# Patient Record
Sex: Female | Born: 1970 | Race: White | Hispanic: No | Marital: Married | Smoking: Never smoker
Health system: Southern US, Community
[De-identification: ages and names within clinical notes are randomized; demographics above are authoritative.]

## PROBLEM LIST (undated history)

## (undated) DIAGNOSIS — R200 Anesthesia of skin: Secondary | ICD-10-CM

## (undated) DIAGNOSIS — T8859XA Other complications of anesthesia, initial encounter: Secondary | ICD-10-CM

## (undated) DIAGNOSIS — D649 Anemia, unspecified: Secondary | ICD-10-CM

## (undated) DIAGNOSIS — R9082 White matter disease, unspecified: Secondary | ICD-10-CM

## (undated) DIAGNOSIS — R112 Nausea with vomiting, unspecified: Secondary | ICD-10-CM

## (undated) DIAGNOSIS — G459 Transient cerebral ischemic attack, unspecified: Secondary | ICD-10-CM

## (undated) DIAGNOSIS — R202 Paresthesia of skin: Secondary | ICD-10-CM

## (undated) DIAGNOSIS — R519 Headache, unspecified: Secondary | ICD-10-CM

## (undated) DIAGNOSIS — G35 Multiple sclerosis: Secondary | ICD-10-CM

## (undated) DIAGNOSIS — C801 Malignant (primary) neoplasm, unspecified: Secondary | ICD-10-CM

## (undated) DIAGNOSIS — T4145XA Adverse effect of unspecified anesthetic, initial encounter: Secondary | ICD-10-CM

## (undated) DIAGNOSIS — H539 Unspecified visual disturbance: Secondary | ICD-10-CM

## (undated) DIAGNOSIS — Z9889 Other specified postprocedural states: Secondary | ICD-10-CM

## (undated) DIAGNOSIS — R51 Headache: Secondary | ICD-10-CM

## (undated) HISTORY — DX: Unspecified visual disturbance: H53.9

## (undated) HISTORY — PX: APPENDECTOMY: SHX54

---

## 2007-04-12 ENCOUNTER — Ambulatory Visit: Payer: Self-pay | Admitting: Gastroenterology

## 2007-11-04 ENCOUNTER — Ambulatory Visit: Payer: Self-pay

## 2008-11-06 ENCOUNTER — Ambulatory Visit: Payer: Self-pay

## 2009-11-08 ENCOUNTER — Ambulatory Visit: Payer: Self-pay

## 2009-12-19 ENCOUNTER — Ambulatory Visit: Payer: Self-pay | Admitting: Obstetrics and Gynecology

## 2009-12-21 ENCOUNTER — Ambulatory Visit: Payer: Self-pay | Admitting: Obstetrics and Gynecology

## 2009-12-24 ENCOUNTER — Inpatient Hospital Stay: Payer: Self-pay | Admitting: Obstetrics and Gynecology

## 2010-10-27 HISTORY — PX: ABDOMINAL HYSTERECTOMY: SHX81

## 2011-02-19 ENCOUNTER — Ambulatory Visit: Payer: Self-pay | Admitting: Obstetrics and Gynecology

## 2011-02-21 ENCOUNTER — Ambulatory Visit: Payer: Self-pay | Admitting: Obstetrics and Gynecology

## 2011-02-25 LAB — PATHOLOGY REPORT

## 2011-03-06 ENCOUNTER — Ambulatory Visit: Payer: Self-pay | Admitting: Obstetrics and Gynecology

## 2011-05-15 ENCOUNTER — Ambulatory Visit: Payer: Self-pay | Admitting: Obstetrics and Gynecology

## 2012-05-25 ENCOUNTER — Ambulatory Visit: Payer: Self-pay | Admitting: Obstetrics and Gynecology

## 2013-05-26 ENCOUNTER — Ambulatory Visit: Payer: Self-pay | Admitting: Obstetrics and Gynecology

## 2013-07-11 ENCOUNTER — Ambulatory Visit: Payer: Self-pay | Admitting: Family Medicine

## 2013-10-27 DIAGNOSIS — C4491 Basal cell carcinoma of skin, unspecified: Secondary | ICD-10-CM | POA: Insufficient documentation

## 2015-08-29 ENCOUNTER — Other Ambulatory Visit: Payer: Self-pay | Admitting: Obstetrics and Gynecology

## 2015-08-29 DIAGNOSIS — Z79890 Hormone replacement therapy (postmenopausal): Secondary | ICD-10-CM

## 2015-08-29 DIAGNOSIS — E8940 Asymptomatic postprocedural ovarian failure: Secondary | ICD-10-CM | POA: Insufficient documentation

## 2015-08-29 DIAGNOSIS — Z1231 Encounter for screening mammogram for malignant neoplasm of breast: Secondary | ICD-10-CM

## 2015-09-07 ENCOUNTER — Ambulatory Visit: Payer: Self-pay

## 2015-09-19 ENCOUNTER — Ambulatory Visit
Admission: RE | Admit: 2015-09-19 | Discharge: 2015-09-19 | Disposition: A | Discharge status: Home / Self Care | Payer: BC Managed Care – PPO | Source: Ambulatory Visit | Attending: Obstetrics and Gynecology | Admitting: Obstetrics and Gynecology

## 2015-09-19 DIAGNOSIS — Z1231 Encounter for screening mammogram for malignant neoplasm of breast: Secondary | ICD-10-CM | POA: Diagnosis present

## 2016-01-02 ENCOUNTER — Other Ambulatory Visit: Payer: Self-pay | Admitting: Neurology

## 2016-01-02 DIAGNOSIS — R410 Disorientation, unspecified: Secondary | ICD-10-CM

## 2016-01-18 ENCOUNTER — Ambulatory Visit: Payer: BC Managed Care – PPO

## 2016-06-27 DIAGNOSIS — G459 Transient cerebral ischemic attack, unspecified: Secondary | ICD-10-CM

## 2016-06-27 HISTORY — DX: Transient cerebral ischemic attack, unspecified: G45.9

## 2016-07-24 ENCOUNTER — Observation Stay
Admission: EM | Admit: 2016-07-24 | Discharge: 2016-07-25 | Disposition: A | Discharge status: Home / Self Care | Payer: BC Managed Care – PPO | Attending: Internal Medicine | Admitting: Internal Medicine

## 2016-07-24 ENCOUNTER — Ambulatory Visit (INDEPENDENT_AMBULATORY_CARE_PROVIDER_SITE_OTHER)
Admission: EM | Admit: 2016-07-24 | Discharge: 2016-07-24 | Disposition: A | Discharge status: Other Acute Inpatient Hospital | Payer: BC Managed Care – PPO | Source: Home / Self Care | Attending: Family Medicine | Admitting: Family Medicine

## 2016-07-24 ENCOUNTER — Emergency Department: Payer: BC Managed Care – PPO

## 2016-07-24 ENCOUNTER — Encounter: Payer: Self-pay | Admitting: Emergency Medicine

## 2016-07-24 ENCOUNTER — Observation Stay
Admit: 2016-07-24 | Discharge: 2016-07-24 | Disposition: A | Discharge status: Home / Self Care | Payer: BC Managed Care – PPO | Attending: Internal Medicine | Admitting: Internal Medicine

## 2016-07-24 ENCOUNTER — Encounter: Payer: Self-pay | Admitting: *Deleted

## 2016-07-24 DIAGNOSIS — I639 Cerebral infarction, unspecified: Secondary | ICD-10-CM

## 2016-07-24 DIAGNOSIS — I10 Essential (primary) hypertension: Secondary | ICD-10-CM | POA: Diagnosis not present

## 2016-07-24 DIAGNOSIS — G43109 Migraine with aura, not intractable, without status migrainosus: Principal | ICD-10-CM | POA: Insufficient documentation

## 2016-07-24 DIAGNOSIS — Z90710 Acquired absence of both cervix and uterus: Secondary | ICD-10-CM | POA: Insufficient documentation

## 2016-07-24 DIAGNOSIS — I081 Rheumatic disorders of both mitral and tricuspid valves: Secondary | ICD-10-CM | POA: Insufficient documentation

## 2016-07-24 DIAGNOSIS — G8194 Hemiplegia, unspecified affecting left nondominant side: Secondary | ICD-10-CM | POA: Diagnosis not present

## 2016-07-24 DIAGNOSIS — R200 Anesthesia of skin: Secondary | ICD-10-CM | POA: Insufficient documentation

## 2016-07-24 DIAGNOSIS — H539 Unspecified visual disturbance: Secondary | ICD-10-CM | POA: Diagnosis not present

## 2016-07-24 DIAGNOSIS — Z885 Allergy status to narcotic agent status: Secondary | ICD-10-CM | POA: Diagnosis not present

## 2016-07-24 DIAGNOSIS — Z823 Family history of stroke: Secondary | ICD-10-CM | POA: Diagnosis not present

## 2016-07-24 DIAGNOSIS — G459 Transient cerebral ischemic attack, unspecified: Secondary | ICD-10-CM | POA: Insufficient documentation

## 2016-07-24 DIAGNOSIS — E785 Hyperlipidemia, unspecified: Secondary | ICD-10-CM | POA: Insufficient documentation

## 2016-07-24 DIAGNOSIS — R410 Disorientation, unspecified: Secondary | ICD-10-CM

## 2016-07-24 DIAGNOSIS — H5340 Unspecified visual field defects: Secondary | ICD-10-CM | POA: Diagnosis not present

## 2016-07-24 DIAGNOSIS — R531 Weakness: Secondary | ICD-10-CM

## 2016-07-24 LAB — COMPREHENSIVE METABOLIC PANEL
ALBUMIN: 4.4 g/dL (ref 3.5–5.0)
ALK PHOS: 75 U/L (ref 38–126)
ALT: 33 U/L (ref 14–54)
AST: 20 U/L (ref 15–41)
Anion gap: 8 (ref 5–15)
BILIRUBIN TOTAL: 0.7 mg/dL (ref 0.3–1.2)
BUN: 11 mg/dL (ref 6–20)
CALCIUM: 9.4 mg/dL (ref 8.9–10.3)
CO2: 27 mmol/L (ref 22–32)
Chloride: 104 mmol/L (ref 101–111)
Creatinine, Ser: 0.92 mg/dL (ref 0.44–1.00)
GFR calc Af Amer: >60 mL/min (ref >60)
GFR calc non Af Amer: >60 mL/min (ref >60)
GLUCOSE: 98 mg/dL (ref 65–99)
Potassium: 3.6 mmol/L (ref 3.5–5.1)
SODIUM: 139 mmol/L (ref 135–145)
TOTAL PROTEIN: 7.7 g/dL (ref 6.5–8.1)

## 2016-07-24 LAB — URINALYSIS COMPLETE WITH MICROSCOPIC (ARMC ONLY)
BACTERIA UA: NONE SEEN
BILIRUBIN URINE: NEGATIVE
Glucose, UA: NEGATIVE mg/dL
HGB URINE DIPSTICK: NEGATIVE
LEUKOCYTES UA: NEGATIVE
NITRITE: NEGATIVE
PH: 7 (ref 5.0–8.0)
Protein, ur: NEGATIVE mg/dL
Specific Gravity, Urine: 1.004 — ABNORMAL LOW (ref 1.005–1.030)

## 2016-07-24 LAB — CBC
HCT: 43.2 % (ref 35.0–47.0)
HEMOGLOBIN: 15.1 g/dL (ref 12.0–16.0)
MCH: 31.3 pg (ref 26.0–34.0)
MCHC: 35 g/dL (ref 32.0–36.0)
MCV: 89.4 fL (ref 80.0–100.0)
Platelets: 200 10*3/uL (ref 150–440)
RBC: 4.84 MIL/uL (ref 3.80–5.20)
RDW: 12.3 % (ref 11.5–14.5)
WBC: 6.9 10*3/uL (ref 3.6–11.0)

## 2016-07-24 LAB — URINE DRUG SCREEN, QUALITATIVE (ARMC ONLY)
AMPHETAMINES, UR SCREEN: NOT DETECTED
BARBITURATES, UR SCREEN: NOT DETECTED
BENZODIAZEPINE, UR SCRN: NOT DETECTED
COCAINE METABOLITE, UR ~~LOC~~: NOT DETECTED
Cannabinoid 50 Ng, Ur ~~LOC~~: NOT DETECTED
MDMA (Ecstasy)Ur Screen: NOT DETECTED
Methadone Scn, Ur: NOT DETECTED
OPIATE, UR SCREEN: NOT DETECTED
PHENCYCLIDINE (PCP) UR S: NOT DETECTED
Tricyclic, Ur Screen: NOT DETECTED

## 2016-07-24 LAB — PROTIME-INR
INR: 0.98
Prothrombin Time: 13 seconds (ref 11.4–15.2)

## 2016-07-24 LAB — DIFFERENTIAL
BASOS ABS: 0 10*3/uL (ref 0–0.1)
Basophils Relative: 0 %
Eosinophils Absolute: 0 10*3/uL (ref 0–0.7)
Eosinophils Relative: 1 %
LYMPHS ABS: 1.7 10*3/uL (ref 1.0–3.6)
LYMPHS PCT: 25 %
Monocytes Absolute: 0.4 10*3/uL (ref 0.2–0.9)
Monocytes Relative: 6 %
NEUTROS ABS: 4.7 10*3/uL (ref 1.4–6.5)
NEUTROS PCT: 68 %

## 2016-07-24 LAB — APTT: APTT: 27 s (ref 24–36)

## 2016-07-24 LAB — TROPONIN I: Troponin I: <0.03 ng/mL (ref <0.03)

## 2016-07-24 LAB — ETHANOL: Alcohol, Ethyl (B): <5 mg/dL (ref <5)

## 2016-07-24 MED ORDER — ASPIRIN 325 MG PO TABS
325.0000 mg | ORAL_TABLET | Freq: Every day | ORAL | Status: DC
  Administered 2016-07-24: 20:00:00 325 mg via ORAL
  Filled 2016-07-24 (×2): qty 1

## 2016-07-24 MED ORDER — CHOLECALCIFEROL 10 MCG (400 UNIT) PO TABS
400.0000 [IU] | ORAL_TABLET | Freq: Every day | ORAL | Status: DC
  Administered 2016-07-24: 400 [IU] via ORAL
  Filled 2016-07-24 (×3): qty 1

## 2016-07-24 MED ORDER — ACETAMINOPHEN 650 MG RE SUPP: 650.0000 mg | Freq: Four times a day (QID) | RECTAL | Status: DC | PRN

## 2016-07-24 MED ORDER — ONDANSETRON HCL 4 MG PO TABS: 4.0000 mg | ORAL_TABLET | Freq: Four times a day (QID) | ORAL | Status: DC | PRN

## 2016-07-24 MED ORDER — ADULT MULTIVITAMIN W/MINERALS CH
1.0000 | ORAL_TABLET | Freq: Every day | ORAL | Status: DC
  Administered 2016-07-24: 20:00:00 1 via ORAL
  Filled 2016-07-24 (×2): qty 1

## 2016-07-24 MED ORDER — ONDANSETRON HCL 4 MG/2ML IJ SOLN
4.0000 mg | Freq: Four times a day (QID) | INTRAMUSCULAR | Status: DC | PRN
Start: 2016-07-24 — End: 2016-07-25

## 2016-07-24 MED ORDER — ESTRADIOL 1 MG PO TABS
2.0000 mg | ORAL_TABLET | Freq: Every day | ORAL | Status: DC
  Administered 2016-07-24: 2 mg via ORAL
  Filled 2016-07-24 (×2): qty 2
  Filled 2016-07-24: qty 1

## 2016-07-24 MED ORDER — ACETAMINOPHEN 325 MG PO TABS: 650.0000 mg | ORAL_TABLET | Freq: Four times a day (QID) | ORAL | Status: DC | PRN

## 2016-07-24 MED ORDER — ASPIRIN 300 MG RE SUPP: 300.0000 mg | Freq: Every day | RECTAL | Status: DC

## 2016-07-24 MED ORDER — INFLUENZA VAC SPLIT QUAD 0.5 ML IM SUSY: 0.5000 mL | PREFILLED_SYRINGE | INTRAMUSCULAR | Status: DC

## 2016-07-24 MED ORDER — STROKE: EARLY STAGES OF RECOVERY BOOK
Freq: Once | Status: AC
  Administered 2016-07-24: 16:00:00

## 2016-07-24 MED ORDER — DIAZEPAM 5 MG PO TABS: 5.0000 mg | ORAL_TABLET | Freq: Once | ORAL | Status: DC

## 2016-07-24 MED ORDER — ENOXAPARIN SODIUM 40 MG/0.4ML ~~LOC~~ SOLN
40.0000 mg | SUBCUTANEOUS | Status: DC
  Filled 2016-07-24: qty 0.4

## 2016-07-24 MED ORDER — ASPIRIN 81 MG PO CHEW
324.0000 mg | CHEWABLE_TABLET | Freq: Once | ORAL | Status: AC
  Administered 2016-07-24: 324 mg via ORAL
  Filled 2016-07-24: qty 4

## 2016-07-24 MED ORDER — SODIUM CHLORIDE 0.9% FLUSH
3.0000 mL | Freq: Two times a day (BID) | INTRAVENOUS | Status: DC
  Administered 2016-07-24 – 2016-07-25 (×3): 3 mL via INTRAVENOUS

## 2016-07-24 NOTE — ED Notes (Signed)
Patient made aware of need of urine sample  

## 2016-07-24 NOTE — ED Notes (Signed)
Patient transported to CT 

## 2016-07-24 NOTE — H&P (Signed)
[facility] [place] NAME: Jeanette Lang    MR#:  [id number]  DATE OF BIRTH:  [birth year]   DATE OF ADMISSION:  07/24/2016  PRIMARY CARE PHYSICIAN: Golden Pop, MD   REQUESTING/REFERRING PHYSICIAN: Joni Fears  CHIEF COMPLAINT:   Chief Complaint  Patient presents with  . Weakness    HISTORY OF PRESENT ILLNESS:  Jeanette Lang  is a 45 y.o. female without significant medical history who is presenting with acute onset 8:10 07/16/2016 with left peripheral vision deficit. That subsequently resolved Complaining of numbness left side of face left upper extremity left leg she originally went to urgent care with an redirect [facility] emergency department further workup and evaluation. Vision has resolved still complaining of numbness denies any weakness further symptomatology.  PAST MEDICAL HISTORY:   Past Medical History:  Diagnosis Date  . Medical history non-contributory     PAST SURGICAL HISTORY:   Past Surgical History:  Procedure Laterality Date  . ABDOMINAL HYSTERECTOMY  2012   complete  . APPENDECTOMY      SOCIAL HISTORY:   Social History  Substance Use Topics  . Smoking status: Never Smoker  . Smokeless tobacco: Never Used  . Alcohol use No    FAMILY HISTORY:   Family History  Problem Relation Age of Onset  . CVA Neg Hx     DRUG ALLERGIES:   Allergies  Allergen Reactions  . Codeine Nausea And Vomiting    REVIEW OF SYSTEMS:  REVIEW OF SYSTEMS:  CONSTITUTIONAL: Denies fevers, chills, fatigue, weakness.  EYES: Denies blurred vision, double vision, or eye pain.  EARS, NOSE, THROAT: Denies tinnitus, ear pain, hearing loss.  RESPIRATORY: denies cough, shortness of breath, wheezing  CARDIOVASCULAR: Denies chest pain, palpitations, edema.  GASTROINTESTINAL: Denies nausea, vomiting, diarrhea, abdominal pain.  GENITOURINARY: Denies dysuria, hematuria.  ENDOCRINE: Denies nocturia or thyroid problems. HEMATOLOGIC  AND LYMPHATIC: Denies easy bruising or bleeding.  SKIN: Denies rash or lesions.  MUSCULOSKELETAL: Denies pain in neck, back, shoulder, knees, hips, or further arthritic symptoms.  NEUROLOGIC: Denies paralysis, paresthesias. Positive numbness PSYCHIATRIC: Denies anxiety or depressive symptoms. Otherwise full review of systems performed by me is negative.   MEDICATIONS AT HOME:   Prior to Admission medications   Medication Sig Start Date End Date Taking? Authorizing Provider  Cholecalciferol (VITAMIN D PO) Take 1 tablet by mouth daily.    Historical Provider, MD  estradiol (ESTRACE) 2 MG tablet Take 2 mg by mouth daily.    Historical Provider, MD  Multiple Vitamin (MULTIVITAMIN) tablet Take 1 tablet by mouth daily.    Historical Provider, MD      VITAL SIGNS:  Blood pressure 126/75, pulse 85, temperature 98.6 F (37 C), temperature source Oral, resp. rate 19, height 4\' 11"  (1.499 m), weight 53.1 kg (117 lb), SpO2 98 %.  PHYSICAL EXAMINATION:  VITAL SIGNS: Vitals:   07/24/16 1400 07/24/16 1430  BP: 139/77 126/75  Pulse: 81 85  Resp: (!) 21 19  Temp:     GENERAL:45 y.o.female currently in no acute distress.  HEAD: Normocephalic, atraumatic.  EYES: Pupils equal, round, reactive to light. Extraocular muscles intact. No scleral icterus.  MOUTH: Moist mucosal membrane. Dentition intact. No abscess noted.  EAR, NOSE, THROAT: Clear without exudates. No external lesions.  NECK: Supple. No thyromegaly. No nodules. No JVD.  PULMONARY: Clear to ascultation, without wheeze rails or rhonci. No use of accessory muscles, Good respiratory effort. good air entry bilaterally CHEST: Nontender to palpation.  CARDIOVASCULAR:  S1 and S2. Regular rate and rhythm. No murmurs, rubs, or gallops. No edema. Pedal pulses 2+ bilaterally.  GASTROINTESTINAL: Soft, nontender, nondistended. No masses. Positive bowel sounds. No hepatosplenomegaly.  MUSCULOSKELETAL: No swelling, clubbing, or edema. Range of motion  full in all extremities.  NEUROLOGIC: Cranial nerves II through XII are intact. Pronator drift within normal limits subjective light touch diminished left face arm and leg reflexes intact SKIN: No ulceration, lesions, rashes, or cyanosis. Skin warm and dry. Turgor intact.  PSYCHIATRIC: Mood, affect within normal limits. The patient is awake, alert and oriented x 3. Insight, judgment intact.    LABORATORY PANEL:   CBC  Recent Labs Lab 07/24/16 1315  WBC 6.9  HGB 15.1  HCT 43.2  PLT 200   ------------------------------------------------------------------------------------------------------------------  Chemistries   Recent Labs Lab 07/24/16 1315  NA 139  K 3.6  CL 104  CO2 27  GLUCOSE 98  BUN 11  CREATININE 0.92  CALCIUM 9.4  AST 20  ALT 33  ALKPHOS 75  BILITOT 0.7   ------------------------------------------------------------------------------------------------------------------  Cardiac Enzymes  Recent Labs Lab 07/24/16 1315  TROPONINI <0.03   ------------------------------------------------------------------------------------------------------------------  RADIOLOGY:  Ct Head Code Stroke W/o Cm  Result Date: 07/24/2016 CLINICAL DATA:  Code stroke. Sudden onset left visual field deficit, occipital paresthesia, and left-sided weakness. EXAM: CT HEAD WITHOUT CONTRAST TECHNIQUE: Contiguous axial images were obtained from the base of the skull through the vertex without intravenous contrast. COMPARISON:  None. FINDINGS: Brain: There is no evidence of acute cortical infarct, intracranial hemorrhage, mass, midline shift, or extra-axial fluid collection. The ventricles and sulci are normal. Vascular: No hyperdense vessel or unexpected calcification. Skull: No fracture or focal osseous lesion. Sinuses/Orbits: Unremarkable. Other: None. ASPECTS [facility] Stroke Program Early CT Score) - Ganglionic level infarction (caudate, lentiform nuclei, internal capsule, insula, M1-M3  cortex): 7 - Supraganglionic infarction (M4-M6 cortex): 3 Total score (0-10 with 10 being normal): 10 IMPRESSION: 1. Unremarkable head CT. 2. ASPECTS is 10. These results were called by telephone at the time of interpretation on 07/24/2016 at 1:44 pm to Dr. Carrie Mew , who verbally acknowledged these results. Electronically Signed   By: Logan Bores M.D.   On: 07/24/2016 13:44    EKG:   Orders placed or performed during the hospital encounter of 07/24/16  . ED EKG  . ED EKG  . EKG 12-Lead  . EKG 12-Lead    IMPRESSION AND PLAN:   45 year old Caucasian female without significant medical history presenting with left-sided numbness  1. TIA, unspecified: Initiate aspirin/statin therapy if LDL greater than 70, stroke protocol/precautions, place on telemetry, trend cardiac enzymes, check MRI brain, complete echocardiogram, lipid panel, hemoglobin A1c, bilateral carotid Doppler, permissive hypertension first 24 hours treating blood pressure only greater than 220/120. Onset of symptoms 8:10, no TPA given severity of symptoms and outside of window  All the records are reviewed and case discussed with ED provider. Management plans discussed with the patient, family and they are in agreement.  CODE STATUS: Full  TOTAL TIME TAKING CARE OF THIS PATIENT: 33 minutes.    Oday Ridings,  Karenann Cai.D on 07/24/2016 at 3:02 PM  Between 7am to 6pm - Pager - [phone]  After 6pm: House Pager: - [place] [facility]  Office  [phone]  CC: Primary care physician; Golden Pop, MD

## 2016-07-24 NOTE — ED Notes (Signed)
Attempted to call report x 1  

## 2016-07-24 NOTE — ED Notes (Signed)
Last Known Well 0810.

## 2016-07-24 NOTE — ED Triage Notes (Signed)
Patient started having limited periferal vision in left eye, followed by numbness on the left side of her face and head. Patient reports having tunnel vision while at work.

## 2016-07-24 NOTE — ED Triage Notes (Signed)
Per [facility], patient comes from [facility] due to left sided weakness. Patient was sitting in her car, putting on makeup when she noticed her vision in her left eye "go white".  Patient drove to work fine. When patient got work her vision was still not back to normal. Patient turned her head to the right and noticed a tingling feeling to the back of her head. At [facility] patient was slow to answer questions and was unable to state who the president was. Now, patient describes numbness to left hand and both sides of head. Patient A&O x4.

## 2016-07-24 NOTE — ED Notes (Signed)
Admitting MD at bedside.

## 2016-07-24 NOTE — ED Provider Notes (Signed)
[facility]    CSN: [id number] Arrival date & time: 07/24/16  1131     History   Chief Complaint Chief Complaint  Patient presents with  . Visual Field Change  . Numbness    HPI Jeanette Lang is a 45 y.o. female.   Patient's here because of visual disturbance and. Patient states that this morning she was getting way to drive to work this is about 8:00 this morning she had to be at work by 8:30. She reports in the left eye seeing things in the periphery which he couldn't get rid of. She states that then she developed tunnel vision the left eye and the left eye became greater. She was still able to drive to work maybe to work and had her vital signs checked by the school nurse. 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 She reports though during this time along with visual disturbances feeling dizzy and lightheaded. She had some mental problems and did not feel sharp. She states that she still can't remember certain things and that she has really concentrate before she can remember things that she should remember liked her medications when she was talking to the nurse who triaged to her this afternoon. She reports having some memory problems about 2-3 months ago with her husband but it was a very short period and it cleared completely she states that the visual problem is pretty much cleared but she still has some issues with focusing and remembering things. She denies any extremity weakness seizure activity or numbness. Past medical history she does not smoke doesn't know pertinent family medical history as far as strokes in the immediate family. She has had a total abdominal hysterectomy which followed a partial hysterectomy. She is on hormonal treatment and she has had appendectomy before. She does not smoke. She is allergic to codeine   The history is provided by the patient and a friend. No language interpreter was  used.  Eye Problem  Location:  Left eye Severity:  Severe Onset quality:  Sudden Duration:  4 hours Timing:  Unable to specify Progression:  Resolved Chronicity:  New Context: not burn, not chemical exposure, not contact lens problem, not direct trauma, not foreign body, not using machinery, not scratch, not smoke exposure and not UV exposure   Relieved by:  None tried Worsened by:  Nothing Associated symptoms: blurred vision, decreased vision and scotomas   Associated symptoms: no inflammation, no itching, no nausea, no numbness, no photophobia, no swelling and no weakness   Risk factors: no conjunctival hemorrhage, no exposure to pinkeye, no previous injury to eye, no recent herpes zoster and no recent URI     History reviewed. No pertinent past medical history.  There are no active problems to display for this patient.   Past Surgical History:  Procedure Laterality Date  . ABDOMINAL HYSTERECTOMY  2012   complete  . APPENDECTOMY      OB History    No data available       Home Medications    Prior to Admission medications   Medication Sig Start Date End Date Taking? Authorizing Provider  Cholecalciferol (VITAMIN D PO) Take 1 tablet by mouth daily.   Yes Historical Provider, MD  estradiol (ESTRACE) 2 MG tablet Take 2 mg by mouth daily.   Yes Historical Provider, MD  Multiple Vitamin (MULTIVITAMIN) tablet Take 1 tablet by mouth daily.  Yes Historical Provider, MD    Family History History reviewed. No pertinent family history.  Social History Social History  Substance Use Topics  . Smoking status: Never Smoker  . Smokeless tobacco: Never Used  . Alcohol use No     Allergies   Codeine   Review of Systems Review of Systems  Constitutional: Positive for activity change.  Eyes: Positive for blurred vision. Negative for photophobia and itching.  Gastrointestinal: Negative for nausea.  Neurological: Positive for dizziness. Negative for weakness and numbness.        Memory loss   Psychiatric/Behavioral: Negative for suicidal ideas.  All other systems reviewed and are negative.    Physical Exam Triage Vital Signs ED Triage Vitals  Enc Vitals Group     BP 07/24/16 1142 (!) 153/85     Pulse Rate 07/24/16 1142 77     Resp 07/24/16 1142 16     Temp 07/24/16 1142 97.8 F (36.6 C)     Temp Source 07/24/16 1142 Oral     SpO2 07/24/16 1142 100 %     Weight 07/24/16 1143 116 lb (52.6 kg)     Height 07/24/16 1143 4\' 11"  (1.499 m)     Head Circumference --      Peak Flow --      Pain Score 07/24/16 1150 0     Pain Loc --      Pain Edu? --      Excl. in [place]? --    No data found.   Updated Vital Signs BP (!) 153/85 (BP Location: Right Arm)   Pulse 77   Temp 97.8 F (36.6 C) (Oral)   Resp 16   Ht 4\' 11"  (1.499 m)   Wt 116 lb (52.6 kg)   SpO2 100%   BMI 23.43 kg/m   Visual Acuity Right Eye Distance:   Left Eye Distance:   Bilateral Distance:    Right Eye Near:   Left Eye Near:    Bilateral Near:     Physical Exam  Constitutional: She appears well-developed.  HENT:  Head: Normocephalic and atraumatic.  Right Ear: External ear normal.  Left Ear: External ear normal.  Eyes: EOM are normal. Pupils are equal, round, and reactive to light.  Neck: Normal range of motion. Neck supple.  Cardiovascular: Normal rate and regular rhythm.   Pulmonary/Chest: Effort normal and breath sounds normal.  Musculoskeletal: Normal range of motion. She exhibits no tenderness.  Neurological: She has normal reflexes. She displays normal reflexes. No cranial nerve deficit. She exhibits abnormal muscle tone. Coordination normal.  Patient has some weakness of the left upper extremity not profound but not the equal to the right  Skin: Skin is warm and dry.  Psychiatric: Her mood appears anxious. Cognition and memory are impaired.  Patient's anxious she can remember person place and time but there is An Remembering the Presence First Name Initially It  Takes Her A Few Seconds before She Can Remember That the Present First Name Is [name] Once She Is Able to Remember Is [name] She Is Also Positive for Large Remember His Name      UC Treatments / Results  Labs (all labs ordered are listed, but only abnormal results are displayed) Labs Reviewed - No data to display  EKG  EKG Interpretation None      ED ECG REPORT I, Crisol Muecke H, the attending physician, personally viewed and interpreted this ECG.   Date: 07/24/2016  EKG Time:11:51:09  Rate: 76  Rhythm: normal EKG, normal sinus rhythm  Axis: 41  Intervals:none  ST&T Change: none Radiology No results found.  Procedures Procedures (including critical care time)  Medications Ordered in UC Medications - No data to display   Initial Impression / Assessment and Plan / UC Course  I have reviewed the triage vital signs and the nursing notes.  Pertinent labs & imaging results that were available during my care of the patient were reviewed by me and considered in my medical decision making (see chart for details).  Clinical Course    Patient apparently has had some type of cerebrovascular accident is been now more than 4 hours since initial presentation. I recommended EMS transportation to [facility] ED patient was somewhat reluctant friend finally convinced her to go by EMS. Colletta Maryland the charge nurse at [facility] ED notified patient's M.D. transport.  Final Clinical Impressions(s) / UC Diagnoses   Final diagnoses:  Visual disturbance  Mental confusion    New Prescriptions Discharge Medication List as of 07/24/2016 12:33 PM       Frederich Cha, MD 07/24/16 [phone]

## 2016-07-24 NOTE — ED Provider Notes (Signed)
[facility] Emergency Department Provider Note  ____________________________________________  Time seen: Approximately 1:23 PM  I have reviewed the triage vital signs and the nursing notes.   HISTORY  Chief Complaint Weakness    HPI Jeanette Lang is a 45 y.o. female sudden onset of left visual field deficit at 8 AM. She also subsided develop paresthesia in the occiput. She was seen at urgent care and referred to the ED for further evaluation. She denies ever having anything like this before. No recent illness, no trauma, no hypertension hyperlipidemia diabetes.     Past Medical History:  Diagnosis Date  . Medical history non-contributory      Patient Active Problem List   Diagnosis Date Noted  . TIA (transient ischemic attack) 07/24/2016     Past Surgical History:  Procedure Laterality Date  . ABDOMINAL HYSTERECTOMY  2012   complete  . APPENDECTOMY       Prior to Admission medications   Medication Sig Start Date End Date Taking? Authorizing Provider  Cholecalciferol (VITAMIN D PO) Take 1 tablet by mouth daily.    Historical Provider, MD  estradiol (ESTRACE) 2 MG tablet Take 2 mg by mouth daily.    Historical Provider, MD  Multiple Vitamin (MULTIVITAMIN) tablet Take 1 tablet by mouth daily.    Historical Provider, MD     Allergies Codeine   Family History  Problem Relation Age of Onset  . CVA Neg Hx     Social History Social History  Substance Use Topics  . Smoking status: Never Smoker  . Smokeless tobacco: Never Used  . Alcohol use No    Review of Systems  Constitutional:   No fever or chills.  ENT:   No sore throat. No rhinorrhea. Cardiovascular:   No chest pain. Respiratory:   No dyspnea or cough. Gastrointestinal:   Negative for abdominal pain, vomiting and diarrhea.  Genitourinary:   Negative for dysuria or difficulty urinating. Musculoskeletal:   Negative for focal pain or swelling Neurological:   positive  headache and visual changes as aboves 10-point ROS otherwise negative.  ____________________________________________   PHYSICAL EXAM:  VITAL SIGNS: ED Triage Vitals  Enc Vitals Group     BP 07/24/16 1315 (!) 141/84     Pulse Rate 07/24/16 1315 83     Resp 07/24/16 1315 17     Temp --      Temp src --      SpO2 07/24/16 1315 100 %     Weight 07/24/16 1300 117 lb (53.1 kg)     Height 07/24/16 1300 4\' 11"  (1.499 m)     Head Circumference --      Peak Flow --      Pain Score 07/24/16 1300 0     Pain Loc --      Pain Edu? --      Excl. in [place]? --     Vital signs reviewed, nursing assessments reviewed.   Constitutional:   Alert and oriented. Well appearing and in no distress. Eyes:   No scleral icterus. No conjunctival pallor. PERRL. EOMI.  No nystagmus.funduscopy unremarkable ENT   Head:   Normocephalic and atraumatic.   Nose:   No congestion/rhinnorhea. No septal hematoma   Mouth/Throat:   MMM, no pharyngeal erythema. No peritonsillar mass.    Neck:   No stridor. No SubQ emphysema. No meningismus. Hematological/Lymphatic/Immunilogical:   No cervical lymphadenopathy. Cardiovascular:   RRR. Symmetric bilateral radial and DP pulses.  No murmurs.  Respiratory:  Normal respiratory effort without tachypnea nor retractions. Breath sounds are clear and equal bilaterally. No wheezes/rales/rhonchi. Gastrointestinal:   Soft and nontender. Non distended. There is no CVA tenderness.  No rebound, rigidity, or guarding. Genitourinary:   deferred Musculoskeletal:   Nontender with normal range of motion in all extremities. No joint effusions.  No lower extremity tenderness.  No edema. Neurologic:   Some word finding difficulty, some inadvertent word substitution's.  CN 2-10 normal. Visual fields normal Motor exam reveals slight relative weakness of the left arm and left leg globally compared to the right Finger to nose normal. No pronator drift.Marland Kitchen NIH stroke scale 4  Skin:     Skin is warm, dry and intact. No rash noted.  No petechiae, purpura, or bullae.  ____________________________________________    LABS (pertinent positives/negatives) (all labs ordered are listed, but only abnormal results are displayed) Labs Reviewed  PROTIME-INR  APTT  CBC  DIFFERENTIAL  COMPREHENSIVE METABOLIC PANEL  TROPONIN I  ETHANOL  URINE DRUG SCREEN, QUALITATIVE ([facility] ONLY)  URINALYSIS COMPLETEWITH MICROSCOPIC ([place])  CBC  CREATININE, SERUM   ____________________________________________   EKG  Interpreted by me  Date: 07/24/2016  Rate: 86  Rhythm: normal sinus rhythm  QRS Axis: normal  Intervals: normal  ST/T Wave abnormalities: normal  Conduction Disutrbances: none  Narrative Interpretation: unremarkable      ____________________________________________    RADIOLOGY  CT head unremarkable  ____________________________________________   PROCEDURES Procedures  ____________________________________________   INITIAL IMPRESSION / ASSESSMENT AND PLAN / ED COURSE  Pertinent labs & imaging results that were available during my care of the patient were reviewed by me and considered in my medical decision making (see chart for details).  Patient presents withheadache vision change and found to have left-sided weakness. Deficits aren't severe, stroke scale is low, and has been 5 hours since symptom onset. I believe the risk of TPA outweighs the benefit at this point and she is not a suitable candidate. We'll proceed with stroke workup.   Clinical Course    ----------------------------------------- 2:45 PM on 07/24/2016 -----------------------------------------  Workup unremarkable. Aspirin given. Recommended hospitalization for further workup and risk stratification and risk factor modification, to which patient agrees. Case discussed with hospitalist. ____________________________________________   FINAL CLINICAL IMPRESSION(S) / ED  DIAGNOSES  Final diagnoses:  Acute CVA (cerebrovascular accident) [facility])       Portions of this note were generated with dragon dictation software. Dictation errors may occur despite best attempts at proofreading.    Carrie Mew, MD 07/24/16 [phone]

## 2016-07-25 ENCOUNTER — Observation Stay: Payer: BC Managed Care – PPO

## 2016-07-25 ENCOUNTER — Observation Stay (HOSPITAL_COMMUNITY): Payer: BC Managed Care – PPO

## 2016-07-25 DIAGNOSIS — H539 Unspecified visual disturbance: Secondary | ICD-10-CM

## 2016-07-25 DIAGNOSIS — G8194 Hemiplegia, unspecified affecting left nondominant side: Secondary | ICD-10-CM | POA: Diagnosis not present

## 2016-07-25 DIAGNOSIS — M6289 Other specified disorders of muscle: Secondary | ICD-10-CM | POA: Diagnosis not present

## 2016-07-25 DIAGNOSIS — G43109 Migraine with aura, not intractable, without status migrainosus: Secondary | ICD-10-CM

## 2016-07-25 LAB — LIPID PANEL
Cholesterol: 183 mg/dL (ref 0–200)
HDL: 55 mg/dL (ref >40)
LDL Cholesterol: 108 mg/dL — ABNORMAL HIGH (ref 0–99)
TRIGLYCERIDES: 99 mg/dL (ref <150)
Total CHOL/HDL Ratio: 3.3 RATIO
VLDL: 20 mg/dL (ref 0–40)

## 2016-07-25 LAB — ECHOCARDIOGRAM COMPLETE
E decel time: 155 msec
EERAT: 6.9
FS: 34 % (ref 28–44)
HEIGHTINCHES: 59 in
IV/PV OW: 0.86
LA diam end sys: 30 mm
LA diam index: 1.99 cm/m2
LA vol index: 28.1 mL/m2
LA vol: 42.4 mL
LASIZE: 30 mm
LAVOLA4C: 38.6 mL
LV E/e' medial: 6.9
LV E/e'average: 6.9
LV TDI E'LATERAL: 11.5
LVELAT: 11.5 cm/s
MV Dec: 155
MV Peak grad: 3 mmHg
MV pk A vel: 58.7 m/s
MVPKEVEL: 79.4 m/s
PW: 7.61 mm — AB (ref 0.6–1.1)
RV LATERAL S' VELOCITY: 12.3 cm/s
TAPSE: 23.2 mm
TDI e' medial: 11.2
WEIGHTICAEL: 1900.8 [oz_av]

## 2016-07-25 LAB — C-REACTIVE PROTEIN: CRP: 0.5 mg/dL (ref <1.0)

## 2016-07-25 LAB — SEDIMENTATION RATE: Sed Rate: 2 mm/hr (ref 0–20)

## 2016-07-25 LAB — TSH: TSH: 2.126 u[IU]/mL (ref 0.350–4.500)

## 2016-07-25 LAB — CKMB (ARMC ONLY): CK, MB: 1.2 ng/mL (ref 0.5–5.0)

## 2016-07-25 MED ORDER — ASPIRIN EC 81 MG PO TBEC: 81.0000 mg | DELAYED_RELEASE_TABLET | Freq: Every day | ORAL | 0 refills | Status: AC

## 2016-07-25 MED ORDER — IOPAMIDOL (ISOVUE-370) INJECTION 76%
75.0000 mL | Freq: Once | INTRAVENOUS | Status: AC | PRN
  Administered 2016-07-25: 75 mL via INTRAVENOUS

## 2016-07-25 NOTE — Consult Note (Signed)
Reason for Consult:Visual changes and left sided numbness/weakness Referring Physician: Ether Griffins  CC: Visual changes, left sided numbness/weakness  HPI: Jeanette Lang is an 45 y.o. female with a history of migraines who reports that on yesterday when in the car on her way to work had the onset of a big white area in her visual field in the left eye.  Did not cover each eye.  Was able to drive but noted that when she looked to the left she was unable to see. Was able to drive to work without trouble and was able to park without difficulties.  Went to work and felt the left side of her face become numb, as well as the left side.  Felt weak on the left.  Then had the onset of numbness on the back of her head.  While at work noted that her memory was poor and she was having difficulty thinking through things.  Despite cognitive issues and above focal complaints patient was able to read, write and see everything on her computer screen.  Presented for evaluation with no improvement in symptoms.  Continues to think slowly and describes generalized weakness that is worse on the left.   Migraines occur about once a week and last for about a day. Are behind the left eye.  Has Imitrex.    Past Medical History:  Diagnosis Date  . Medical history non-contributory     Past Surgical History:  Procedure Laterality Date  . ABDOMINAL HYSTERECTOMY  2012   complete  . APPENDECTOMY      Family History  Problem Relation Age of Onset  . CVA Neg Hx     Social History:  reports that she has never smoked. She has never used smokeless tobacco. She reports that she does not drink alcohol or use drugs.  Allergies  Allergen Reactions  . Codeine Nausea And Vomiting    Medications:  I have reviewed the patient's current medications. Prior to Admission:  Prescriptions Prior to Admission  Medication Sig Dispense Refill Last Dose  . Cholecalciferol (VITAMIN D PO) Take 1 tablet by mouth daily.   07/23/2016 at  0900  . estradiol (ESTRACE) 2 MG tablet Take 2 mg by mouth daily.   07/23/2016 at 0900  . Multiple Vitamin (MULTIVITAMIN) tablet Take 1 tablet by mouth daily.   07/23/2016 at 0900   Scheduled: . aspirin  300 mg Rectal Daily   Or  . aspirin  325 mg Oral Daily  . cholecalciferol  400 Units Oral Daily  . diazepam  5 mg Oral Once  . enoxaparin (LOVENOX) injection  40 mg Subcutaneous Q24H  . estradiol  2 mg Oral Daily  . Influenza vac split quadrivalent PF  0.5 mL Intramuscular Tomorrow-1000  . multivitamin with minerals  1 tablet Oral Daily  . sodium chloride flush  3 mL Intravenous Q12H    ROS: History obtained from the patient  General ROS: negative for - chills, fatigue, fever, night sweats, weight gain or weight loss Psychological ROS: negative for - behavioral disorder, hallucinations, memory difficulties, mood swings or suicidal ideation Ophthalmic ROS: as noted in HPI ENT ROS: negative for - epistaxis, nasal discharge, oral lesions, sore throat, tinnitus or vertigo Allergy and Immunology ROS: negative for - hives or itchy/watery eyes Hematological and Lymphatic ROS: negative for - bleeding problems, bruising or swollen lymph nodes Endocrine ROS: negative for - galactorrhea, hair pattern changes, polydipsia/polyuria or temperature intolerance Respiratory ROS: negative for - cough, hemoptysis, shortness of breath or  wheezing Cardiovascular ROS: negative for - chest pain, dyspnea on exertion, edema or irregular heartbeat Gastrointestinal ROS: negative for - abdominal pain, diarrhea, hematemesis, nausea/vomiting or stool incontinence Genito-Urinary ROS: negative for - dysuria, hematuria, incontinence or urinary frequency/urgency Musculoskeletal ROS: negative for - joint swelling or muscular weakness Neurological ROS: as noted in HPI Dermatological ROS: negative for rash and skin lesion changes  Physical Examination: Blood pressure 120/68, pulse 77, temperature 97.6 F (36.4 C),  temperature source Oral, resp. rate 20, height '4\' 11"'$  (1.499 m), weight 53.9 kg (118 lb 12.8 oz), SpO2 99 %.  HEENT-  Normocephalic, no lesions, without obvious abnormality.  Normal external eye and conjunctiva.  Normal TM's bilaterally.  Normal auditory canals and external ears. Normal external nose, mucus membranes and septum.  Normal pharynx. Cardiovascular- S1, S2 normal, pulses palpable throughout   Lungs- chest clear, no wheezing, rales, normal symmetric air entry Abdomen- soft, non-tender; bowel sounds normal; no masses,  no organomegaly Extremities- no edema Lymph-no adenopathy palpable Musculoskeletal-no joint tenderness, deformity or swelling Skin-warm and dry, no hyperpigmentation, vitiligo, or suspicious lesions  Neurological Examination Mental Status: Alert, oriented, thought content appropriate.  Speech fluent without evidence of aphasia.  Able to follow 3 step commands without difficulty. Cranial Nerves: II: Discs flat bilaterally; Visual fields grossly normal, pupils equal, round, reactive to light and accommodation III,IV, VI: ptosis not present, extra-ocular motions intact bilaterally V,VII: smile symmetric, facial light touch sensation decreased on the left VIII: hearing normal bilaterally IX,X: gag reflex present XI: bilateral shoulder shrug XII: midline tongue extension Motor: Patient able to extend both upper extremities with no drift noted but unable to provide any resistance bilaterally.  Left hand grip 5-/5.  5/5 in the bilateral lower extremities except with ankle flexion and extension.  Patient unable to resist examiner with either.  Normal tone and bulk.   Sensory: Pinprick and light touch decreased on the left Deep Tendon Reflexes: 2+ and symmetric throughout Plantars: Right: mute   Left: mute Cerebellar: Normal finger-to-nose, normal rapid alternating movements and normal heel-to-shin testing bilaterally Gait: not tested due to safety  concerns   Laboratory Studies:   Basic Metabolic Panel:  Recent Labs Lab 07/24/16 1315  NA 139  K 3.6  CL 104  CO2 27  GLUCOSE 98  BUN 11  CREATININE 0.92  CALCIUM 9.4    Liver Function Tests:  Recent Labs Lab 07/24/16 1315  AST 20  ALT 33  ALKPHOS 75  BILITOT 0.7  PROT 7.7  ALBUMIN 4.4   No results for input(s): LIPASE, AMYLASE in the last 168 hours. No results for input(s): AMMONIA in the last 168 hours.  CBC:  Recent Labs Lab 07/24/16 1315  WBC 6.9  NEUTROABS 4.7  HGB 15.1  HCT 43.2  MCV 89.4  PLT 200    Cardiac Enzymes:  Recent Labs Lab 07/24/16 1315 07/25/16 1059  CKMB  --  1.2  TROPONINI <0.03  --     BNP: Invalid input(s): POCBNP  CBG: No results for input(s): GLUCAP in the last 168 hours.  Microbiology: No results found for this or any previous visit.  Coagulation Studies:  Recent Labs  07/24/16 1315  LABPROT 13.0  INR 0.98    Urinalysis:  Recent Labs Lab 07/24/16 1513  COLORURINE STRAW*  LABSPEC 1.004*  PHURINE 7.0  GLUCOSEU NEGATIVE  HGBUR NEGATIVE  BILIRUBINUR NEGATIVE  KETONESUR TRACE*  PROTEINUR NEGATIVE  NITRITE NEGATIVE  LEUKOCYTESUR NEGATIVE    Lipid Panel:     Component Value  Date/Time   CHOL 183 07/25/2016 0442   TRIG 99 07/25/2016 0442   HDL 55 07/25/2016 0442   CHOLHDL 3.3 07/25/2016 0442   VLDL 20 07/25/2016 0442   LDLCALC 108 (H) 07/25/2016 0442    HgbA1C: No results found for: HGBA1C  Urine Drug Screen:     Component Value Date/Time   LABOPIA NONE DETECTED 07/24/2016 1513   COCAINSCRNUR NONE DETECTED 07/24/2016 1513   LABBENZ NONE DETECTED 07/24/2016 1513   AMPHETMU NONE DETECTED 07/24/2016 1513   THCU NONE DETECTED 07/24/2016 1513   LABBARB NONE DETECTED 07/24/2016 1513    Alcohol Level:  Recent Labs Lab 07/24/16 1315  ETH <5    Other results: EKG: sinus rhythm at 86 bpm.  Imaging: Ct Angio Neck W Or Wo Contrast  Result Date: 07/25/2016 CLINICAL DATA:  Left eye visual  changes in memory loss beginning at 8:10 a.m. today. Vision has returned to normal. Hypertension spike yesterday. EXAM: CT ANGIOGRAPHY NECK TECHNIQUE: Multidetector CT imaging of the neck was performed using the standard protocol during bolus administration of intravenous contrast. Multiplanar CT image reconstructions and MIPs were obtained to evaluate the vascular anatomy. Carotid stenosis measurements (when applicable) are obtained utilizing NASCET criteria, using the distal internal carotid diameter as the denominator. CONTRAST:  75 mL Isovue 370 COMPARISON:  CT head without contrast 07/24/2016 FINDINGS: Aortic arch: A 3 vessel arch configuration is present. No significant focal atherosclerotic calcification or stenosis is present. Right carotid system: The right common carotid artery is within normal limits. The bifurcation is normal. The cervical right ICA is unremarkable. Left carotid system: The left common carotid artery is within normal limits. The left carotid bifurcation is normal. The cervical left ICA is within normal limits. Vertebral arteries: Both vertebral arteries originate from the subclavian arteries. The right vertebral artery is slightly dominant to the left. There is no significant focal stenosis or vascular injury to either vertebral artery in the neck. PICA origin Sir within normal limits. The vertebrobasilar junction is normal. Skeleton: Vertebral body heights and alignment are normal. No focal lytic or blastic lesions are present. Other neck: No focal mucosal or submucosal lesions are present. Salivary glands are within normal limits. The thyroid is unremarkable. There is no significant cervical adenopathy. Upper chest: The lung apices are clear. No focal nodule, mass, or airspace disease is present. The superior mediastinum is within normal limits. IMPRESSION: Negative CTA of the neck. Electronically Signed   By: San Morelle M.D.   On: 07/25/2016 10:18   Mr Brain Wo  Contrast  Result Date: 07/25/2016 CLINICAL DATA:  Acute onset left peripheral vision deficit which has resolved. Left-sided numbness. EXAM: MRI HEAD WITHOUT CONTRAST TECHNIQUE: Multiplanar, multiecho pulse sequences of the brain and surrounding structures were obtained without intravenous contrast. COMPARISON:  CT head 07/24/2016 FINDINGS: Brain: Ventricle size normal.  Cerebral volume normal. Negative for acute infarction. No significant chronic ischemia. Negative for demyelinating disease. Negative for hemorrhage or fluid collection. Negative for mass or edema. No shift of the midline structures. Vascular: Normal flow voids. Skull and upper cervical spine: Normal marrow signal. Sinuses/Orbits: Negative Other: None IMPRESSION: Normal MRI of the brain. Electronically Signed   By: Franchot Gallo M.D.   On: 07/25/2016 10:24   Ct Head Code Stroke W/o Cm  Result Date: 07/24/2016 CLINICAL DATA:  Code stroke. Sudden onset left visual field deficit, occipital paresthesia, and left-sided weakness. EXAM: CT HEAD WITHOUT CONTRAST TECHNIQUE: Contiguous axial images were obtained from the base of the skull through  the vertex without intravenous contrast. COMPARISON:  None. FINDINGS: Brain: There is no evidence of acute cortical infarct, intracranial hemorrhage, mass, midline shift, or extra-axial fluid collection. The ventricles and sulci are normal. Vascular: No hyperdense vessel or unexpected calcification. Skull: No fracture or focal osseous lesion. Sinuses/Orbits: Unremarkable. Other: None. ASPECTS [facility] Stroke Program Early CT Score) - Ganglionic level infarction (caudate, lentiform nuclei, internal capsule, insula, M1-M3 cortex): 7 - Supraganglionic infarction (M4-M6 cortex): 3 Total score (0-10 with 10 being normal): 10 IMPRESSION: 1. Unremarkable head CT. 2. ASPECTS is 10. These results were called by telephone at the time of interpretation on 07/24/2016 at 1:44 pm to Dr. Carrie Mew , who verbally  acknowledged these results. Electronically Signed   By: Logan Bores M.D.   On: 07/24/2016 13:44     Assessment/Plan: 45 year old female presenting with complaints of visual changes and left hemiparesis.  MRI of the brain personally reviewed and shows no acute changes.  CTA unremarkable as well.  Symptoms may very well represent a migraine equivalent since patient's migraines are usually on the left as well.    Recommendations: 1.  TSH, Vitamin D level, ESR, CK, CRP, myasthenia panel 2.  EEG 3.  If above unremarkable, patient may continue work up on an outpatient basis with symptoms expected to continue to improve.    Alexis Goodell, MD Neurology [phone] 07/25/2016, 12:25 PM

## 2016-07-25 NOTE — Discharge Summary (Signed)
[name] NAME: Jeanette Lang    MR#:  [id number]  DATE OF BIRTH:  1971-03-12  DATE OF ADMISSION:  07/24/2016 ADMITTING PHYSICIAN: Lytle Butte, MD  DATE OF DISCHARGE: No discharge date for patient encounter.  PRIMARY CARE PHYSICIAN: Golden Pop, MD     ADMISSION DIAGNOSIS:  TIA (transient ischemic attack) [G45.9] Acute CVA (cerebrovascular accident) ([place]) [I63.9]  DISCHARGE DIAGNOSIS:  Principal Problem:   Complicated migraine Active Problems:   TIA (transient ischemic attack)   Left-sided weakness   SECONDARY DIAGNOSIS:   Past Medical History:  Diagnosis Date  . Medical history non-contributory     .pro HOSPITAL COURSE:   The patient is a 45 year old Caucasian female with past medical history significant for history of migraine headaches who presents to the hospital with complaints of right-sided  headache, left-sided weakness and numbness. Due to concerns of stroke, patient was admitted to the hospital for further evaluation and treatment. She was initiated on aspirin therapy, CT scan of the head was performed, unremarkable. MRI of the brain and neck CT angiogram were also unremarkable, no stroke was identified. Patient was seen by neurologist, Dr. Doy Mince who felt that it was complicated migraine, electroencephalogram was performed, normal . Additional studies, CPR 0.5, TSH 2.126, normal, sedimentation rate 2, normal, lipid panel revealed LDL of 108, total cholesterol 183, triglycerides 99 . Urine screen was negative . Echocardiogram revealed normal ejection fraction, grade 1 diastolic dysfunction, no defect or patent foramen ovale was identified. Patient was felt to be stable to be discharged home. She refused home health services.  Discussion by problem:  #1. Complicated migraine with left-sided numbness and weakness, patient was advised to continue aspirin therapy, follow-up with Dr. Melrose Nakayama, primary  neurologist as outpatient. The patient was advised also to continue follow-up with primary care physician, Dr. Janae Bridgeman . Myasthenia panel is pending. Electroencephalogram is normal.  #2. Hyperlipidemia, patient was advised to continue low-fat, low-cholesterol diet #3. Left-sided numbness and weakness, supportive therapy was recommended, patient refused home health services, no stroke on brain MRI, patient was seen by neurologist and was not felt to be TIA or stroke #4 . Grade 1 diastolic dysfunction on echocardiogram, patient blood pressure readings on normal, follow blood pressure as outpatient closely and initiate medications if needed DISCHARGE CONDITIONS:   Stable  CONSULTS OBTAINED:  Treatment Team:  Lytle Butte, MD Alexis Goodell, MD  DRUG ALLERGIES:   Allergies  Allergen Reactions  . Codeine Nausea And Vomiting    DISCHARGE MEDICATIONS:   Current Discharge Medication List    START taking these medications   Details  aspirin EC 81 MG tablet Take 1 tablet (81 mg total) by mouth daily. Qty: 30 tablet, Refills: 0      CONTINUE these medications which have NOT CHANGED   Details  Cholecalciferol (VITAMIN D PO) Take 1 tablet by mouth daily.    estradiol (ESTRACE) 2 MG tablet Take 2 mg by mouth daily.    Multiple Vitamin (MULTIVITAMIN) tablet Take 1 tablet by mouth daily.         DISCHARGE INSTRUCTIONS:    Patient's a follow-up with primary care physician, primary neurologist as outpatient  If you experience worsening of your admission symptoms, develop shortness of breath, life threatening emergency, suicidal or homicidal thoughts you must seek medical attention immediately by calling 911 or calling your MD immediately  if symptoms less severe.  You Must read complete instructions/literature along with all the possible  adverse reactions/side effects for all the Medicines you take and that have been prescribed to you. Take any new Medicines after you have completely  understood and accept all the possible adverse reactions/side effects.   Please note  You were cared for by a hospitalist during your hospital stay. If you have any questions about your discharge medications or the care you received while you were in the hospital after you are discharged, you can call the unit and asked to speak with the hospitalist on call if the hospitalist that took care of you is not available. Once you are discharged, your primary care physician will handle any further medical issues. Please note that NO REFILLS for any discharge medications will be authorized once you are discharged, as it is imperative that you return to your primary care physician (or establish a relationship with a primary care physician if you do not have one) for your aftercare needs so that they can reassess your need for medications and monitor your lab values.    Today   CHIEF COMPLAINT:   Chief Complaint  Patient presents with  . Weakness    HISTORY OF PRESENT ILLNESS:  Jeanette Lang  is a 45 y.o. female with a known history of migraine headaches who presents to the hospital with complaints of right-sided  headache, left-sided weakness and numbness. Due to concerns of stroke, patient was admitted to the hospital for further evaluation and treatment. She was initiated on aspirin therapy, CT scan of the head was performed, unremarkable. MRI of the brain and neck CT angiogram were also unremarkable, no stroke was identified. Patient was seen by neurologist, Dr. Doy Mince who felt that it was complicated migraine, electroencephalogram was performed, normal . Additional studies, CPR 0.5, TSH 2.126, normal, sedimentation rate 2, normal, lipid panel revealed LDL of 108, total cholesterol 183, triglycerides 99 . Urine screen was negative . Echocardiogram revealed normal ejection fraction, grade 1 diastolic dysfunction, no defect or patent foramen ovale was identified. Patient was felt to be stable to be  discharged home. She refused home health services.  Discussion by problem:  #1. Complicated migraine with left-sided numbness and weakness, patient was advised to continue aspirin therapy, follow-up with Dr. Melrose Nakayama, primary neurologist as outpatient. The patient was advised also to continue follow-up with primary care physician, Dr. Janae Bridgeman . Myasthenia panel is pending. Electroencephalogram is normal.  #2. Hyperlipidemia, patient was advised to continue low-fat, low-cholesterol diet #3. Left-sided numbness and weakness, supportive therapy was recommended, patient refused home health services, no stroke on brain MRI, patient was seen by neurologist and was not felt to be TIA or stroke #4 . Grade 1 diastolic dysfunction on echocardiogram, patient blood pressure readings on normal, follow blood pressure as outpatient closely and initiate medications if needed    VITAL SIGNS:  Blood pressure 115/73, pulse 94, temperature 97.9 F (36.6 C), temperature source Oral, resp. rate 19, height 4\' 11"  (1.499 m), weight 53.9 kg (118 lb 12.8 oz), SpO2 99 %.  I/O:   Intake/Output Summary (Last 24 hours) at 07/25/16 1533 Last data filed at 07/25/16 0543  Gross per 24 hour  Intake              600 ml  Output                0 ml  Net              600 ml    PHYSICAL EXAMINATION:  GENERAL:  45 y.o.-year-old patient lying  in the bed with no acute distress.  EYES: Pupils equal, round, reactive to light and accommodation. No scleral icterus. Extraocular muscles intact.  HEENT: Head atraumatic, normocephalic. Oropharynx and nasopharynx clear.  NECK:  Supple, no jugular venous distention. No thyroid enlargement, no tenderness.  LUNGS: Normal breath sounds bilaterally, no wheezing, rales,rhonchi or crepitation. No use of accessory muscles of respiration.  CARDIOVASCULAR: S1, S2 normal. No murmurs, rubs, or gallops.  ABDOMEN: Soft, non-tender, non-distended. Bowel sounds present. No organomegaly or mass.   EXTREMITIES: No pedal edema, cyanosis, or clubbing.  NEUROLOGIC: Cranial nerves II through XII are intact. Muscle strength 5/5 in all extremities. Sensation intact. Gait not checked.  PSYCHIATRIC: The patient is alert and oriented x 3.  SKIN: No obvious rash, lesion, or ulcer.   DATA REVIEW:   CBC  Recent Labs Lab 07/24/16 1315  WBC 6.9  HGB 15.1  HCT 43.2  PLT 200    Chemistries   Recent Labs Lab 07/24/16 1315  NA 139  K 3.6  CL 104  CO2 27  GLUCOSE 98  BUN 11  CREATININE 0.92  CALCIUM 9.4  AST 20  ALT 33  ALKPHOS 75  BILITOT 0.7    Cardiac Enzymes  Recent Labs Lab 07/24/16 1315  [place] <0.03    Microbiology Results  No results found for this or any previous visit.  RADIOLOGY:  Ct Angio Neck W Or Wo Contrast  Result Date: 07/25/2016 CLINICAL DATA:  Left eye visual changes in memory loss beginning at 8:10 a.m. today. Vision has returned to normal. Hypertension spike yesterday. EXAM: CT ANGIOGRAPHY NECK TECHNIQUE: Multidetector CT imaging of the neck was performed using the standard protocol during bolus administration of intravenous contrast. Multiplanar CT image reconstructions and MIPs were obtained to evaluate the vascular anatomy. Carotid stenosis measurements (when applicable) are obtained utilizing NASCET criteria, using the distal internal carotid diameter as the denominator. CONTRAST:  75 mL Isovue 370 COMPARISON:  CT head without contrast 07/24/2016 FINDINGS: Aortic arch: A 3 vessel arch configuration is present. No significant focal atherosclerotic calcification or stenosis is present. Right carotid system: The right common carotid artery is within normal limits. The bifurcation is normal. The cervical right ICA is unremarkable. Left carotid system: The left common carotid artery is within normal limits. The left carotid bifurcation is normal. The cervical left ICA is within normal limits. Vertebral arteries: Both vertebral arteries originate from the  subclavian arteries. The right vertebral artery is slightly dominant to the left. There is no significant focal stenosis or vascular injury to either vertebral artery in the neck. PICA origin Sir within normal limits. The vertebrobasilar junction is normal. Skeleton: Vertebral body heights and alignment are normal. No focal lytic or blastic lesions are present. Other neck: No focal mucosal or submucosal lesions are present. Salivary glands are within normal limits. The thyroid is unremarkable. There is no significant cervical adenopathy. Upper chest: The lung apices are clear. No focal nodule, mass, or airspace disease is present. The superior mediastinum is within normal limits. IMPRESSION: Negative CTA of the neck. Electronically Signed   By: San Morelle M.D.   On: 07/25/2016 10:18   Mr Brain Wo Contrast  Result Date: 07/25/2016 CLINICAL DATA:  Acute onset left peripheral vision deficit which has resolved. Left-sided numbness. EXAM: MRI HEAD WITHOUT CONTRAST TECHNIQUE: Multiplanar, multiecho pulse sequences of the brain and surrounding structures were obtained without intravenous contrast. COMPARISON:  CT head 07/24/2016 FINDINGS: Brain: Ventricle size normal.  Cerebral volume normal. Negative for acute  infarction. No significant chronic ischemia. Negative for demyelinating disease. Negative for hemorrhage or fluid collection. Negative for mass or edema. No shift of the midline structures. Vascular: Normal flow voids. Skull and upper cervical spine: Normal marrow signal. Sinuses/Orbits: Negative Other: None IMPRESSION: Normal MRI of the brain. Electronically Signed   By: Franchot Gallo M.D.   On: 07/25/2016 10:24   Ct Head Code Stroke W/o Cm  Result Date: 07/24/2016 CLINICAL DATA:  Code stroke. Sudden onset left visual field deficit, occipital paresthesia, and left-sided weakness. EXAM: CT HEAD WITHOUT CONTRAST TECHNIQUE: Contiguous axial images were obtained from the base of the skull through the  vertex without intravenous contrast. COMPARISON:  None. FINDINGS: Brain: There is no evidence of acute cortical infarct, intracranial hemorrhage, mass, midline shift, or extra-axial fluid collection. The ventricles and sulci are normal. Vascular: No hyperdense vessel or unexpected calcification. Skull: No fracture or focal osseous lesion. Sinuses/Orbits: Unremarkable. Other: None. ASPECTS [facility] Stroke Program Early CT Score) - Ganglionic level infarction (caudate, lentiform nuclei, internal capsule, insula, M1-M3 cortex): 7 - Supraganglionic infarction (M4-M6 cortex): 3 Total score (0-10 with 10 being normal): 10 IMPRESSION: 1. Unremarkable head CT. 2. ASPECTS is 10. These results were called by telephone at the time of interpretation on 07/24/2016 at 1:44 pm to Dr. Carrie Mew , who verbally acknowledged these results. Electronically Signed   By: Logan Bores M.D.   On: 07/24/2016 13:44    EKG:   Orders placed or performed during the hospital encounter of 07/24/16  . ED EKG  . ED EKG  . EKG 12-Lead  . EKG 12-Lead      Management plans discussed with the patient, family and they are in agreement.  CODE STATUS:     Code Status Orders        Start     Ordered   07/24/16 1444  Full code  Continuous     07/24/16 1443    Code Status History    Date Active Date Inactive Code Status Order ID Comments User Context   07/24/2016  2:43 PM 07/24/2016  5:36 PM Full Code [id number]  Lytle Butte, MD ED      TOTAL TIME TAKING CARE OF THIS PATIENT: 40  minutes.    Theodoro Grist M.D on 07/25/2016 at 3:33 PM  Between 7am to 6pm - Pager - [phone]  After 6pm go to [email or web address] - password [email or web address] [facility]  Office  [phone]  CC: Primary care physician; Golden Pop, MD

## 2016-07-25 NOTE — Progress Notes (Signed)
MD making rounds. Order received to discharge home. IV removed. Prescription E-scribed to pharmacy. Discharge paperwork provided, explained, signed and witnessed. No unanswered questions. Discharged via wheelchair by nursing staff. Belongings sent with patient and family.

## 2016-07-25 NOTE — Progress Notes (Signed)
         To Whom It May Concern         Ms. Jeanette Lang was hospitalized at [facility] 07/24/2016 through 07/25/2016. She is to follow-up with her primary care physician within the next few days after discharge. She is to return back to work at full capacity 07/28/2016 or later, per her primary care physician's discretion. Thank you for your understanding.       Sincerely,   Theodoro Grist, MD

## 2016-07-26 LAB — HEMOGLOBIN A1C
HEMOGLOBIN A1C: 5.3 % (ref 4.8–5.6)
MEAN PLASMA GLUCOSE: 105 mg/dL

## 2016-07-26 LAB — VITAMIN D 25 HYDROXY (VIT D DEFICIENCY, FRACTURES): VIT D 25 HYDROXY: 50.9 ng/mL (ref 30.0–100.0)

## 2016-07-31 LAB — ACETYLCHOLINE RECEPTOR AB, ALL
Acetylchol Block Ab: 19 % (ref 0–25)
Acetylcholine Modulat Ab: <12 % (ref 0–20)

## 2016-09-01 ENCOUNTER — Other Ambulatory Visit: Payer: Self-pay | Admitting: Obstetrics and Gynecology

## 2016-09-01 DIAGNOSIS — Z1231 Encounter for screening mammogram for malignant neoplasm of breast: Secondary | ICD-10-CM

## 2016-09-15 ENCOUNTER — Other Ambulatory Visit: Payer: Self-pay | Admitting: Nurse Practitioner

## 2016-09-15 ENCOUNTER — Ambulatory Visit
Admission: RE | Admit: 2016-09-15 | Discharge: 2016-09-15 | Disposition: A | Discharge status: Home / Self Care | Payer: BC Managed Care – PPO | Source: Ambulatory Visit | Attending: Nurse Practitioner | Admitting: Nurse Practitioner

## 2016-09-15 DIAGNOSIS — R0602 Shortness of breath: Secondary | ICD-10-CM

## 2016-09-15 DIAGNOSIS — R52 Pain, unspecified: Secondary | ICD-10-CM

## 2016-09-15 DIAGNOSIS — R079 Chest pain, unspecified: Secondary | ICD-10-CM | POA: Diagnosis not present

## 2016-09-25 ENCOUNTER — Ambulatory Visit: Payer: BC Managed Care – PPO

## 2016-09-30 DIAGNOSIS — R200 Anesthesia of skin: Secondary | ICD-10-CM | POA: Insufficient documentation

## 2016-09-30 DIAGNOSIS — R4189 Other symptoms and signs involving cognitive functions and awareness: Secondary | ICD-10-CM | POA: Insufficient documentation

## 2016-11-06 ENCOUNTER — Other Ambulatory Visit: Payer: Self-pay | Admitting: Nurse Practitioner

## 2016-11-06 DIAGNOSIS — R079 Chest pain, unspecified: Secondary | ICD-10-CM

## 2016-12-19 ENCOUNTER — Other Ambulatory Visit: Payer: Self-pay | Admitting: Nurse Practitioner

## 2016-12-19 DIAGNOSIS — M792 Neuralgia and neuritis, unspecified: Secondary | ICD-10-CM

## 2016-12-31 ENCOUNTER — Ambulatory Visit: Payer: BC Managed Care – PPO

## 2016-12-31 DIAGNOSIS — R768 Other specified abnormal immunological findings in serum: Secondary | ICD-10-CM | POA: Insufficient documentation

## 2017-01-07 DIAGNOSIS — R531 Weakness: Secondary | ICD-10-CM

## 2017-03-11 ENCOUNTER — Ambulatory Visit (INDEPENDENT_AMBULATORY_CARE_PROVIDER_SITE_OTHER): Payer: BC Managed Care – PPO | Admitting: Neurology

## 2017-03-11 ENCOUNTER — Encounter (INDEPENDENT_AMBULATORY_CARE_PROVIDER_SITE_OTHER): Payer: Self-pay

## 2017-03-11 ENCOUNTER — Encounter: Payer: Self-pay | Admitting: Neurology

## 2017-03-11 DIAGNOSIS — F09 Unspecified mental disorder due to known physiological condition: Secondary | ICD-10-CM | POA: Diagnosis not present

## 2017-03-11 DIAGNOSIS — R768 Other specified abnormal immunological findings in serum: Secondary | ICD-10-CM

## 2017-03-11 DIAGNOSIS — G43109 Migraine with aura, not intractable, without status migrainosus: Secondary | ICD-10-CM | POA: Diagnosis not present

## 2017-03-11 DIAGNOSIS — R0683 Snoring: Secondary | ICD-10-CM

## 2017-03-11 DIAGNOSIS — R200 Anesthesia of skin: Secondary | ICD-10-CM

## 2017-03-11 DIAGNOSIS — R7689 Other specified abnormal immunological findings in serum: Secondary | ICD-10-CM

## 2017-03-11 DIAGNOSIS — F329 Major depressive disorder, single episode, unspecified: Secondary | ICD-10-CM | POA: Diagnosis not present

## 2017-03-11 DIAGNOSIS — G4719 Other hypersomnia: Secondary | ICD-10-CM

## 2017-03-11 DIAGNOSIS — F32A Depression, unspecified: Secondary | ICD-10-CM

## 2017-03-11 MED ORDER — ESCITALOPRAM OXALATE 10 MG PO TABS: 10.0000 mg | ORAL_TABLET | Freq: Every day | ORAL | 5 refills | Status: DC

## 2017-03-11 NOTE — Progress Notes (Signed)
[facility]  PATIENT: Jeanette Lang DOB: Jul 09, 1971  REFERRING DOCTOR OR PCP:  Leretha Pol, NP SOURCE: patient, extensive notes from neurology and rheumatology from [place], labs and imaging reports, MRI images on PACS. _________________________________   HISTORICAL  CHIEF COMPLAINT:  Chief Complaint  Patient presents with  . Visual Disturbance    Jeanette Lang is here with her mother Peter Congo for eval of 10 min. episoded of vision loss left eye in September 2018.  Sts. 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 She also c/o intermittent left sided numbness, including facial numbness, since September 2017.   Sts. she has been told sx. may be related to "silent migraine."  Sts. she has been seen by Dr. Manuella Ghazi in [place], was told she is a "complex pt." and should continue f/u with [facility]. Sts. [facility] neuro "just   . Numbness    wants to throw medicine at it" and the neurologist there told her he had no clear dx. and "was just throwing up a hail mary." /fim  . Headache    HISTORY OF PRESENT ILLNESS:  I had the pleasure seeing you patient, Jeanette Lang, at [facility] for neurologic consultation regarding her episode of visual loss and episodes of numbness.      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   She also had tingling in the left occiput and numbness and weakness on the left side of her body.   She had an MRI of the brain and CTA of the neck.   She reported that she had the onset of memory problems at that time and she continues to have issues.     She feels she has regained some strength on the left but not back to baseline and it fluctuates, sometimes feeling worse.   She continues to have left sided numbness and tingling.  When the tingling is more severe her leg is involved.   She notes  gait is worse when her leg  She continues to have memory difficulty and has trouble coming up with the right words.   Cognition fluctuates but she reports it has not returned to normal.   It is usually worse when she has more numbness but can get worse without changes in numbness.   She has seen Dr. Werner Lean at [facility] and Dr. Manuella Ghazi in [place]. Most recently, Dr. Werner Lean recommended that she start nortriptyline but it has not been tried.    She saw rheumatology at [facility] 12/31/2016 for her positive ANA and presence of Sjogren's antibodies.  SSB was borderline and SSA was elevated. She was felt not to have the typical stigmata of Sjogren's syndromes.   She had a skin biopsy that was negative.    She reports another episode at work where her right leg had swelling at the knee.   It resolved the next day.     She gets occasional migraines, less than one a month.  She gets a strobe light aura at times but none recently.   She feels the vision issues occurring last year were completely different.     She had been prescribed Topamax in October 2017.   It was stopped after Neurocognitive testing at [facility]  She had a tick bite 2 years ago and recounts a target rash.    Her Lyme IgG  was positive but WB was negative at [facility].    She denies significant bladder issues with nocturia x 1.        She has had some mood issues the last year.   She gets crying spells and wonders if she has PBA as these spells can occur even if not feeling sad.    She is frustrated when she can't get words out and moody at times.   She cried in office while we were discussing these issues.    She is always tired.   Her Garmin Stress Monitor (like a Fitbit) shows that she moves around a lot at night. She falls asleep easily.   She does not recall waking up much at night.      She snores sometimes but not always.     EPWORTH SLEEPINESS SCALE  On a scale of 0 - 3 what is the chance of dozing:  Sitting and Reading:   3 Watching  TV:    3 Sitting inactive in a public place: 0 Passenger in car for one hour: 3 Lying down to rest in the afternoon: 3 Sitting and talking to someone: 0 Sitting quietly after lunch:  0 In a car, stopped in traffic:  1  Total (out of 24):  13/24   I personally reviewed the MRI of the brain dated 07/25/2016. It shows a few T2/FLAIR hyperintense foci in the subcortical and deep white matter. This could represent a sequela of migraine headache or minimal age-appropriate chronic microvascular ischemic change. There were no acute findings. The CT angiogram of the neck did not show any stenosis.  She had additional imaging studies at [facility] 09/05/2016.   The MRI of the brain, MRI of the cervical spine and MR angiogram were read as normal.  OCT by ophthalmology was normal. I reviewed some lab work from 12/11/2016. CMP, CBC, iron, B12, folate, thyroid function tests, vitamin D, creatine kinase, Lyme antibody were all normal. The ANA showed that she had the presence of Sjogren's antibodies.  REVIEW OF SYSTEMS: Constitutional: No fevers, chills, sweats, or change in appetite Eyes: No visual changes, double vision, eye pain Ear, nose and throat: No hearing loss, ear pain, nasal congestion, sore throat Cardiovascular: No chest pain, palpitations Respiratory: No shortness of breath at rest or with exertion.   No wheezes GastrointestinaI: No nausea, vomiting, diarrhea, abdominal pain, fecal incontinence Genitourinary: No dysuria, urinary retention or frequency.  No nocturia. Musculoskeletal: No neck pain, back pain Integumentary: No rash, pruritus, skin lesions Neurological: as above Psychiatric: No depression at this time.  No anxiety Endocrine: No palpitations, diaphoresis, change in appetite, change in weigh or increased thirst Hematologic/Lymphatic: No anemia, purpura, petechiae. Allergic/Immunologic: No itchy/runny eyes, nasal congestion, recent allergic reactions, rashes  ALLERGIES: Allergies   Allergen Reactions  . Codeine Nausea And Vomiting    HOME MEDICATIONS:  Current Outpatient Prescriptions:  .  aspirin EC 81 MG tablet, Take 1 tablet (81 mg total) by mouth daily., Disp: 30 tablet, Rfl: 0 .  Cholecalciferol (VITAMIN D PO), Take 1 tablet by mouth daily., Disp: , Rfl:  .  escitalopram (LEXAPRO) 10 MG tablet, Take 1 tablet (10 mg total) by mouth daily., Disp: 30 tablet, Rfl: 5 .  estradiol (ESTRACE) 2 MG tablet, Take 2 mg by mouth daily., Disp: , Rfl:  .  Multiple Vitamin (MULTIVITAMIN) tablet, Take 1 tablet by mouth daily., Disp: , Rfl:   PAST MEDICAL HISTORY: Past Medical History:  Diagnosis Date  . Medical history non-contributory   .  Vision abnormalities     PAST SURGICAL HISTORY: Past Surgical History:  Procedure Laterality Date  . ABDOMINAL HYSTERECTOMY  2012   complete  . APPENDECTOMY      FAMILY HISTORY: Family History  Problem Relation Age of Onset  . CVA Neg Hx     SOCIAL HISTORY:  Social History   Social History  . Marital status: Married    Spouse name: N/A  . Number of children: N/A  . Years of education: N/A   Occupational History  . Not on file.   Social History Main Topics  . Smoking status: Never Smoker  . Smokeless tobacco: Never Used  . Alcohol use No  . Drug use: No  . Sexual activity: Not on file   Other Topics Concern  . Not on file   Social History Narrative  . No narrative on file     PHYSICAL EXAM  Vitals:   03/11/17 0901  BP: 140/88  Pulse: 76  Resp: 16  Weight: 126 lb 8 oz (57.4 kg)  Height: 4\' 11"  (1.499 m)    Body mass index is 25.55 kg/m.   General: The patient is well-developed and well-nourished and in no acute distress  Eyes:  Funduscopic exam shows normal optic discs and retinal vessels.  Neck: The neck is supple, no carotid bruits are noted.  The neck is nontender.  Cardiovascular: The heart has a regular rate and rhythm with a normal S1 and S2. There were no murmurs, gallops or rubs.  Lungs are clear to auscultation.  Skin: Extremities are without significant edema.  Musculoskeletal:  Back is nontender  Neurologic Exam  Mental status: The patient is alert and oriented x 3 at the time of the examination. The patient has apparent normal recent and remote memory, with an apparently normal attention span and concentration ability.   Speech is mostly normal but she had trouble coming up with a rightward a few times.  Cranial nerves: Extraocular movements are full. Pupils are equal, round, and reactive to light and accomodation.  Visual fields are full.  Facial symmetry is present. She reports decreased facial sensation to temperature on the left. Vibratory sensation was similar but just slightly reduced at the left forehead compared to the right. Facial strength is normal.  Trapezius and sternocleidomastoid strength is normal. No dysarthria is noted.  The tongue is midline, and the patient has symmetric elevation of the soft palate. No obvious hearing deficits are noted.  Motor:  Muscle bulk is normal.   Tone is normal. Strength is  5 / 5 in all 4 extremities.   Sensory: She reports decreased sensation to touch temperature and vibration in the left arm compared to the right., She reports slightly reduce sensation to touch but symmetric sensation to vibration.  Coordination: Cerebellar testing reveals good finger-nose-finger on the right and very slightly reduced on the left and heel-to-shin was normal bilaterally.  Gait and station: Station is normal.   Gait is normal. Tandem gait is normal. Romberg is negative.   Reflexes: Deep tendon reflexes are symmetric and normal bilaterally.   Plantar responses are flexor.  Other:   She had a Tinel's signs at the left greater than right elbows causing tingling.   No Tinel signs at the wrists.     DIAGNOSTIC DATA (LABS, IMAGING, TESTING) - I reviewed patient records, labs, notes, testing and imaging myself where available.  Lab  Results  Component Value Date   WBC 6.9 07/24/2016   HGB 15.1 07/24/2016  HCT 43.2 07/24/2016   MCV 89.4 07/24/2016   PLT 200 07/24/2016      Component Value Date/Time   NA 139 07/24/2016 1315   K 3.6 07/24/2016 1315   CL 104 07/24/2016 1315   CO2 27 07/24/2016 1315   GLUCOSE 98 07/24/2016 1315   BUN 11 07/24/2016 1315   CREATININE 0.92 07/24/2016 1315   CALCIUM 9.4 07/24/2016 1315   PROT 7.7 07/24/2016 1315   ALBUMIN 4.4 07/24/2016 1315   AST 20 07/24/2016 1315   ALT 33 07/24/2016 1315   ALKPHOS 75 07/24/2016 1315   BILITOT 0.7 07/24/2016 1315   GFRNONAA >60 07/24/2016 1315   GFRAA >60 07/24/2016 1315   Lab Results  Component Value Date   CHOL 183 07/25/2016   HDL 55 07/25/2016   LDLCALC 108 (H) 07/25/2016   TRIG 99 07/25/2016   CHOLHDL 3.3 07/25/2016   Lab Results  Component Value Date   HGBA1C 5.3 07/25/2016   No results found for: VITAMINB12 Lab Results  Component Value Date   TSH 2.126 07/25/2016       ASSESSMENT AND PLAN  Cognitive disorder  Numbness  Snoring - Plan: Split night study  Excessive daytime sleepiness - Plan: Split night study  Migraine with aura and without status migrainosus, not intractable  Positive ANA (antinuclear antibody)  Depression, unspecified depression type    In summary, Mrs. Lenhardt is a 46 year old woman who reports cognitive difficulties that started in September 2017 associated with an episode of visual changes and left-sided numbness and weakness. She also continues to have fluctuating left-sided numbness, though it has improved compared to last year. Most troublesome to her that her cognitive difficulties have persisted and have not improved much over the last 6 months.  I discussed with her that at her age, and given the results of the imaging studies and lab work, that the most likely explanation for her symptoms would be either depression, sleep issues or a combination of both.     I will start Lexapro 10  mg and we can increase this if she gets a partial response. Additionally we will check a split-night study as she has snoring and excessive daytime sleepiness, potentially worrisome for obstructive sleep apnea. In the results we can treat with either an oral appliance with CPAP if OSA is present. If she has significant restless leg syndrome/PLMS or other fragmented sleep, this can be treated as well.  She will return to see me in 2 months or sooner if there are new or worsening neurologic symptoms.   If the numbness worsens, we will also consider a nerve conduction/EMG though possible ulnar or median neuropathies can only explain some of her numbness and not her other symptoms.    Thank you for asking me to see Mrs. Loma Boston for neurologic consultation. Please let me know if I can be of further assistance with her or other patients in the future.    Richard A. Felecia Shelling, MD, PhD 2/84/1324, 40:10 AM Certified in Neurology, Clinical Neurophysiology, Sleep Medicine, Pain Medicine and Neuroimaging  [facility] [address], [place], [place] [address] [phone]

## 2017-04-06 ENCOUNTER — Telehealth: Payer: Self-pay | Admitting: Neurology

## 2017-04-06 NOTE — Telephone Encounter (Signed)
We have attempted to call the patient 3 times to schedule sleep study. Patient has been unavailable at the phone numbers we have on file and has not returned our calls. At this point we will send a letter asking pt to please contact the sleep lab to schedule their sleep study. If patient calls back we will schedule them for their sleep study. ° °

## 2017-04-28 ENCOUNTER — Telehealth: Payer: Self-pay | Admitting: Neurology

## 2017-04-28 NOTE — Telephone Encounter (Signed)
Labs from February 2018 showed EBV IgM and IgG were positive. Additionally, the ANA was positive and the SSA was elevated greater than 8 and the SSB was borderline at 1.0.

## 2017-05-12 ENCOUNTER — Ambulatory Visit: Payer: BC Managed Care – PPO | Admitting: Neurology

## 2017-09-01 ENCOUNTER — Other Ambulatory Visit: Payer: Self-pay | Admitting: Obstetrics and Gynecology

## 2017-09-01 DIAGNOSIS — Z1231 Encounter for screening mammogram for malignant neoplasm of breast: Secondary | ICD-10-CM

## 2017-09-26 HISTORY — PX: BREAST EXCISIONAL BIOPSY: SUR124

## 2017-09-28 ENCOUNTER — Ambulatory Visit
Admission: RE | Admit: 2017-09-28 | Discharge: 2017-09-28 | Disposition: A | Discharge status: Home / Self Care | Payer: BC Managed Care – PPO | Source: Ambulatory Visit | Attending: Obstetrics and Gynecology | Admitting: Obstetrics and Gynecology

## 2017-09-28 DIAGNOSIS — Z1231 Encounter for screening mammogram for malignant neoplasm of breast: Secondary | ICD-10-CM

## 2017-09-29 ENCOUNTER — Other Ambulatory Visit: Payer: Self-pay | Admitting: Obstetrics and Gynecology

## 2017-09-29 DIAGNOSIS — N6310 Unspecified lump in the right breast, unspecified quadrant: Secondary | ICD-10-CM

## 2017-09-30 ENCOUNTER — Ambulatory Visit
Admission: RE | Admit: 2017-09-30 | Discharge: 2017-09-30 | Disposition: A | Discharge status: Home / Self Care | Payer: BC Managed Care – PPO | Source: Ambulatory Visit | Attending: Obstetrics and Gynecology | Admitting: Obstetrics and Gynecology

## 2017-09-30 DIAGNOSIS — N6310 Unspecified lump in the right breast, unspecified quadrant: Secondary | ICD-10-CM

## 2017-10-11 ENCOUNTER — Ambulatory Visit: Payer: Self-pay | Admitting: General Surgery

## 2017-10-12 ENCOUNTER — Ambulatory Visit: Payer: Self-pay | Admitting: General Surgery

## 2017-10-12 NOTE — H&P (Signed)
PATIENT PROFILE: Jeanette Lang is a 46 y.o. female who presents to the Clinic for consultation at the request of Dr. Leafy Ro for evaluation of right breast mass.  PCP:  Lavera Guise, MD  HISTORY OF PRESENT ILLNESS: [name] reports that she was having a screening mammography and upon examination she was found with a palpable mass. Due to this finding the patient had a diagnostic mammography which was negative for any lesion. Patient also had a breast ultrasound that was also negative for any breast mass or lesions. Patient refers that for many years she has felt on and off a mass on that side of the right breast. Denies pain on the area except when the mass is palpated deeply. Patient has had multiple screening mammography who has led to diagnostic mammography which has been reported as benign characteristic of most likely fibrocystic disease. Before the last mammography and finding of the palpable breast mass, they patient was not aware of having that mass, meaning it was asymptomatic. Patient denies any nipple discharge or skin lesions. First menstrual cycle was at 73-48 years old and last when she was 46 years old after hysterectomy. Patient has been on estrogen replacement since 2012. No first degree family member with breast cancer. No previous breast biopsy. No chest radiation.    PROBLEM LIST:         Problem List  Date Reviewed: 01/08/2017         Noted   Left-sided weakness 01/07/2017   Positive ANA (antinuclear antibody) 12/31/2016   Overview    ANA 1/160 speckled  Elevated SSA >8.0 Elevated SSB 1.0 Negative smith, negative (ds DNA, rNP, Jo1, centromeres )      Cognitive impairment 09/30/2016   Left sided numbness 09/30/2016   Overview    Face, arm, leg      Complicated migraine 11/20/5807   Surgical menopause on hormone replacement therapy 08/29/2015   Basal cell carcinoma 10/27/2013   Overview    Excised         GENERAL REVIEW OF SYSTEMS:    General ROS: negative for - chills, fatigue, fever, weight gain or weight loss Allergy and Immunology ROS: negative for - hives  Hematological and Lymphatic ROS: negative for - bleeding problems or bruising, negative for palpable nodes Endocrine ROS: negative for - heat or cold intolerance, hair changes Respiratory ROS: negative for - cough, shortness of breath or wheezing Cardiovascular ROS: no chest pain or palpitations GI ROS: negative for nausea, vomiting, abdominal pain, diarrhea, constipation Musculoskeletal ROS: negative for - joint swelling or muscle pain Neurological ROS: negative for - confusion, syncope Dermatological ROS: negative for pruritus and rash  MEDICATIONS: CurrentMedications        Current Outpatient Medications  Medication Sig Dispense Refill  . aspirin 81 MG EC tablet Take 81 mg by mouth once daily.    . calcium carbonate-vitamin D3 (CALTRATE 600+D) 600 mg(1,500mg ) -200 unit tablet Take 1 tablet by mouth once daily.      Marland Kitchen dextroamphetamine-amphetamine (ADDERALL) 20 mg tablet     . estradiol (ESTRACE) 2 MG tablet Take 1 tablet (2 mg total) by mouth once daily 90 tablet 4  . multivitamin tablet Take 1 tablet by mouth once daily.    . pregabalin (LYRICA) 50 MG capsule Take 50 mg by mouth 2 (two) times daily    . SUMAtriptan (IMITREX) 50 MG tablet Take 1 tablet (50 mg total) by mouth once as needed for Migraine for up to 1 dose. May take  a second dose after 2 hours if needed. 2 tablet 0  . TROKENDI XR 100 mg XR capsule     . TROKENDI XR 50 mg XR capsule      No current facility-administered medications for this visit.       ALLERGIES: Codeine  PAST MEDICAL HISTORY:     Past Medical History:  Diagnosis Date  . Basal cell carcinoma 2015   Excised  . Fatigue   . H/O mammogram 05/25/2012  . H/O mammogram 05/26/2013  . Menopausal and perimenopausal disorder   . Migraine with aura   . Migraines   . Neuralgia   . Vision  abnormalities     PAST SURGICAL HISTORY:      Past Surgical History:  Procedure Laterality Date  . APPENDECTOMY    . COLONOSCOPY  01/29/2011   FH Colon Polyps (Mother)  . COLONOSCOPY  04/26/2014   FH Colon Polyps (Mother): [facility] 04/2019  . CORNEAL EYE SURGERY     LASIK OU  . Excision of Basal Cell Carcinoma on back    . HYSTERECTOMY     2011 partial, 2012 RSO and cervical stump  . HYSTEROSCOPY     with RSO  . OOPHORECTOMY    . OTHER SURGERY     LSO with Lysis of adhesions     FAMILY HISTORY:      Family History  Problem Relation Age of Onset  . High blood pressure (Hypertension) Mother   . Hyperlipidemia (Elevated cholesterol) Mother   . Diabetes type II Maternal Grandfather   . Coronary Artery Disease (Blocked arteries around heart) Maternal Grandfather   . Lung cancer Father   . Prostate cancer Father   . Colon cancer Maternal Aunt   . Colon cancer Paternal Aunt   . Lung cancer Paternal Aunt   . Breast cancer Paternal Aunt   . Dementia Maternal Grandmother      SOCIAL HISTORY: Social History        Socioeconomic History  . Marital status: Married    Spouse name: None  . Number of children: None  . Years of education: None  . Highest education level: None  Social Needs  . Financial resource strain: None  . Food insecurity - worry: None  . Food insecurity - inability: None  . Transportation needs - medical: None  . Transportation needs - non-medical: None  Occupational History  . None  Tobacco Use  . Smoking status: Never Smoker  . Smokeless tobacco: Never Used  Substance and Sexual Activity  . Alcohol use: No  . Drug use: No  . Sexual activity: Yes    Partners: Male    Birth control/protection: Surgical  Other Topics Concern  . None  Social History Narrative   Works as lead Teacher, music.    PHYSICAL EXAM:    Vitals:   10/08/17 1538  BP: 124/85  Pulse: 96  Temp: 36.9 C (98.4 F)    Body mass index is 24.8 kg/m. Weight: 55.7 kg (122 lb 12.8 oz)   General Appearance:    Alert, cooperative, no distress, appears stated age  Head:     Atraumatic, normocephalic  Eyes:   Anciteric, no erythema, no secretions  Neck:   Supple, symmetrical, no JVD, no palpable lymph nodes  Mouth:   Lips, mucosa, and tongue normal;   Lungs:     Clear to auscultation bilaterally, respirations unlabored   Heart:  Breast:    Regular rate and rhythm, S1 and S2 normal, no  murmur, rub   or gallop   Bilateral breast examined on supine and sitting position. Left breast was found without any abnormality. After patient tried to found the mass for a small period of time and found it, a small <1cm mass was palpated flat, difficult discernable borders, mobile. No skin changes, no lymphadenopathy. No nipple retraction or discharge.    Abdomen:     Soft, non-tender, bowel sounds active all four quadrants,    no masses, no organomegaly  Extremities:   Extremities normal, atraumatic, no cyanosis or edema  Skin:   Skin color, texture, turgor normal, no rashes or lesions   Neurologic:   Grossly intact.    REVIEW OF DATA: I have reviewed the following data today:      No visits with results within 3 Month(s) from this visit.  Latest known visit with results is:  Initial consult on 01/07/2017  Component Date Value  . MD (OS) 01/07/2017 -8.84   . MD (OD) 01/07/2017 -2.96   . PSD (OS) 01/07/2017 5.86   . PSD (OD) 01/07/2017 2.43   . MACULA THICKNESS (OS) 01/07/2017 317   . MACULA THICKNESS (OD) 01/07/2017 316   . RNFL (OS) 01/07/2017 99   . RNFL (OD) 01/07/2017 107      ASSESSMENT: Ms. Luckenbaugh is a 46 y.o. female presenting for consultation for right breast mass. On physical exam the patient was found with a small indurated mass on the right upper quadrant, difficult to characterize since it was not the typical round, rubbery or hard breast mass. Personally evaluated the images of the  Diagnostic mammography and breast ultrasound and no masses or calcifications seen. Since the mass was flat and unable to easily grab with my fingers, an office tru cut biopsy will not be possible. Also unable to get images guided biopsy since the mass is not seen on the mammogram or ultrasound. The patient was oriented about getting an MRI to see if with that modality the mass will be able to be seen and image guided biopsy was possible. The patient considered this option was called back the office and refers that due to a high family history of cancer (breast and non breast), the mass is causing anxiety and she "just wanted to get it out". Due to this anxiety and the need to rule out malignancy and patient oriented about the last resource of MRI, will proceed with excisional biopsy.  PLAN: Excisional biopsy of the right breast palpable mass  Patient verbalized understanding, all questions were answered, and were agreeable with the plan outlined above.   Herbert Pun, MD  Electronically signed by Herbert Pun, MD

## 2017-10-12 NOTE — H&P (View-Only) (Signed)
PATIENT PROFILE: Taylour Lietzke is a 46 y.o. female who presents to the Clinic for consultation at the request of Dr. Leafy Ro for evaluation of right breast mass.  PCP:  Lavera Guise, MD  HISTORY OF PRESENT ILLNESS: Ms. Calmes reports that she was having a screening mammography and upon examination she was found with a palpable mass. Due to this finding the patient had a diagnostic mammography which was negative for any lesion. Patient also had a breast ultrasound that was also negative for any breast mass or lesions. Patient refers that for many years she has felt on and off a mass on that side of the right breast. Denies pain on the area except when the mass is palpated deeply. Patient has had multiple screening mammography who has led to diagnostic mammography which has been reported as benign characteristic of most likely fibrocystic disease. Before the last mammography and finding of the palpable breast mass, they patient was not aware of having that mass, meaning it was asymptomatic. Patient denies any nipple discharge or skin lesions. First menstrual cycle was at 51-95 years old and last when she was 46 years old after hysterectomy. Patient has been on estrogen replacement since 2012. No first degree family member with breast cancer. No previous breast biopsy. No chest radiation.    PROBLEM LIST:         Problem List  Date Reviewed: 01/08/2017         Noted   Left-sided weakness 01/07/2017   Positive ANA (antinuclear antibody) 12/31/2016   Overview    ANA 1/160 speckled  Elevated SSA >8.0 Elevated SSB 1.0 Negative smith, negative (ds DNA, rNP, Jo1, centromeres )      Cognitive impairment 09/30/2016   Left sided numbness 09/30/2016   Overview    Face, arm, leg      Complicated migraine 7/40/8144   Surgical menopause on hormone replacement therapy 08/29/2015   Basal cell carcinoma 10/27/2013   Overview    Excised         GENERAL REVIEW OF SYSTEMS:    General ROS: negative for - chills, fatigue, fever, weight gain or weight loss Allergy and Immunology ROS: negative for - hives  Hematological and Lymphatic ROS: negative for - bleeding problems or bruising, negative for palpable nodes Endocrine ROS: negative for - heat or cold intolerance, hair changes Respiratory ROS: negative for - cough, shortness of breath or wheezing Cardiovascular ROS: no chest pain or palpitations GI ROS: negative for nausea, vomiting, abdominal pain, diarrhea, constipation Musculoskeletal ROS: negative for - joint swelling or muscle pain Neurological ROS: negative for - confusion, syncope Dermatological ROS: negative for pruritus and rash  MEDICATIONS: CurrentMedications        Current Outpatient Medications  Medication Sig Dispense Refill  . aspirin 81 MG EC tablet Take 81 mg by mouth once daily.    . calcium carbonate-vitamin D3 (CALTRATE 600+D) 600 mg(1,500mg ) -200 unit tablet Take 1 tablet by mouth once daily.      Marland Kitchen dextroamphetamine-amphetamine (ADDERALL) 20 mg tablet     . estradiol (ESTRACE) 2 MG tablet Take 1 tablet (2 mg total) by mouth once daily 90 tablet 4  . multivitamin tablet Take 1 tablet by mouth once daily.    . pregabalin (LYRICA) 50 MG capsule Take 50 mg by mouth 2 (two) times daily    . SUMAtriptan (IMITREX) 50 MG tablet Take 1 tablet (50 mg total) by mouth once as needed for Migraine for up to 1 dose. May take  a second dose after 2 hours if needed. 2 tablet 0  . TROKENDI XR 100 mg XR capsule     . TROKENDI XR 50 mg XR capsule      No current facility-administered medications for this visit.       ALLERGIES: Codeine  PAST MEDICAL HISTORY:     Past Medical History:  Diagnosis Date  . Basal cell carcinoma 2015   Excised  . Fatigue   . H/O mammogram 05/25/2012  . H/O mammogram 05/26/2013  . Menopausal and perimenopausal disorder   . Migraine with aura   . Migraines   . Neuralgia   . Vision  abnormalities     PAST SURGICAL HISTORY:      Past Surgical History:  Procedure Laterality Date  . APPENDECTOMY    . COLONOSCOPY  01/29/2011   FH Colon Polyps (Mother)  . COLONOSCOPY  04/26/2014   FH Colon Polyps (Mother): C[facility] 04/2019  . CORNEAL EYE SURGERY     LASIK OU  . Excision of Basal Cell Carcinoma on back    . HYSTERECTOMY     2011 partial, 2012 RSO and cervical stump  . HYSTEROSCOPY     with RSO  . OOPHORECTOMY    . OTHER SURGERY     LSO with Lysis of adhesions     FAMILY HISTORY:      Family History  Problem Relation Age of Onset  . High blood pressure (Hypertension) Mother   . Hyperlipidemia (Elevated cholesterol) Mother   . Diabetes type II Maternal Grandfather   . Coronary Artery Disease (Blocked arteries around heart) Maternal Grandfather   . Lung cancer Father   . Prostate cancer Father   . Colon cancer Maternal Aunt   . Colon cancer Paternal Aunt   . Lung cancer Paternal Aunt   . Breast cancer Paternal Aunt   . Dementia Maternal Grandmother      SOCIAL HISTORY: Social History        Socioeconomic History  . Marital status: Married    Spouse name: None  . Number of children: None  . Years of education: None  . Highest education level: None  Social Needs  . Financial resource strain: None  . Food insecurity - worry: None  . Food insecurity - inability: None  . Transportation needs - medical: None  . Transportation needs - non-medical: None  Occupational History  . None  Tobacco Use  . Smoking status: Never Smoker  . Smokeless tobacco: Never Used  Substance and Sexual Activity  . Alcohol use: No  . Drug use: No  . Sexual activity: Yes    Partners: Male    Birth control/protection: Surgical  Other Topics Concern  . None  Social History Narrative   Works as lead Teacher, music.    PHYSICAL EXAM:    Vitals:   10/08/17 1538  BP: 124/85  Pulse: 96  Temp: 36.9 C (98.4 F)    Body mass index is 24.8 kg/m. Weight: 55.7 kg (122 lb 12.8 oz)   General Appearance:    Alert, cooperative, no distress, appears stated age  Head:     Atraumatic, normocephalic  Eyes:   Anciteric, no erythema, no secretions  Neck:   Supple, symmetrical, no JVD, no palpable lymph nodes  Mouth:   Lips, mucosa, and tongue normal;   Lungs:     Clear to auscultation bilaterally, respirations unlabored   Heart:  Breast:    Regular rate and rhythm, S1 and S2 normal, no  murmur, rub   or gallop   Bilateral breast examined on supine and sitting position. Left breast was found without any abnormality. After patient tried to found the mass for a small period of time and found it, a small <1cm mass was palpated flat, difficult discernable borders, mobile. No skin changes, no lymphadenopathy. No nipple retraction or discharge.    Abdomen:     Soft, non-tender, bowel sounds active all four quadrants,    no masses, no organomegaly  Extremities:   Extremities normal, atraumatic, no cyanosis or edema  Skin:   Skin color, texture, turgor normal, no rashes or lesions   Neurologic:   Grossly intact.    REVIEW OF DATA: I have reviewed the following data today:      No visits with results within 3 Month(s) from this visit.  Latest known visit with results is:  Initial consult on 01/07/2017  Component Date Value  . MD (OS) 01/07/2017 -8.84   . MD (OD) 01/07/2017 -2.96   . PSD (OS) 01/07/2017 5.86   . PSD (OD) 01/07/2017 2.43   . MACULA THICKNESS (OS) 01/07/2017 317   . MACULA THICKNESS (OD) 01/07/2017 316   . RNFL (OS) 01/07/2017 99   . RNFL (OD) 01/07/2017 107      ASSESSMENT: Ms. Peace is a 46 y.o. female presenting for consultation for right breast mass. On physical exam the patient was found with a small indurated mass on the right upper quadrant, difficult to characterize since it was not the typical round, rubbery or hard breast mass. Personally evaluated the images of the  Diagnostic mammography and breast ultrasound and no masses or calcifications seen. Since the mass was flat and unable to easily grab with my fingers, an office tru cut biopsy will not be possible. Also unable to get images guided biopsy since the mass is not seen on the mammogram or ultrasound. The patient was oriented about getting an MRI to see if with that modality the mass will be able to be seen and image guided biopsy was possible. The patient considered this option was called back the office and refers that due to a high family history of cancer (breast and non breast), the mass is causing anxiety and she "just wanted to get it out". Due to this anxiety and the need to rule out malignancy and patient oriented about the last resource of MRI, will proceed with excisional biopsy.  PLAN: Excisional biopsy of the right breast palpable mass  Patient verbalized understanding, all questions were answered, and were agreeable with the plan outlined above.   Herbert Pun, MD  Electronically signed by Herbert Pun, MD

## 2017-10-13 ENCOUNTER — Inpatient Hospital Stay: Admission: RE | Admit: 2017-10-13 | Payer: BC Managed Care – PPO | Source: Ambulatory Visit

## 2017-10-14 ENCOUNTER — Other Ambulatory Visit: Payer: Self-pay

## 2017-10-14 ENCOUNTER — Encounter
Admission: RE | Admit: 2017-10-14 | Discharge: 2017-10-14 | Disposition: A | Discharge status: Home / Self Care | Payer: BC Managed Care – PPO | Source: Ambulatory Visit | Attending: General Surgery | Admitting: General Surgery

## 2017-10-14 HISTORY — DX: Anesthesia of skin: R20.2

## 2017-10-14 HISTORY — DX: Other specified postprocedural states: Z98.890

## 2017-10-14 HISTORY — DX: Nausea with vomiting, unspecified: R11.2

## 2017-10-14 HISTORY — DX: Headache: R51

## 2017-10-14 HISTORY — DX: Adverse effect of unspecified anesthetic, initial encounter: T41.45XA

## 2017-10-14 HISTORY — DX: Other complications of anesthesia, initial encounter: T88.59XA

## 2017-10-14 HISTORY — DX: Multiple sclerosis: G35

## 2017-10-14 HISTORY — DX: Transient cerebral ischemic attack, unspecified: G45.9

## 2017-10-14 HISTORY — DX: Malignant (primary) neoplasm, unspecified: C80.1

## 2017-10-14 HISTORY — DX: White matter disease, unspecified: R90.82

## 2017-10-14 HISTORY — DX: Headache, unspecified: R51.9

## 2017-10-14 HISTORY — DX: Paresthesia of skin: R20.0

## 2017-10-14 HISTORY — DX: Anemia, unspecified: D64.9

## 2017-10-14 NOTE — Pre-Procedure Instructions (Signed)
NEUROLOGY CLEARANCE ON CHART FROM DR Nathaneil Canary JEFFREY-LOW RISK

## 2017-10-14 NOTE — Patient Instructions (Signed)
  Your procedure is scheduled on: 10-22-17 Report to Same Day Surgery 2nd floor medical mall [facility] Entrance-take elevator on left to 2nd floor.  Check in with surgery information desk.) To find out your arrival time please call [phone] between 1PM - 3PM on 10-21-17  Remember: Instructions that are not followed completely may result in serious medical risk, up to and including death, or upon the discretion of your surgeon and anesthesiologist your surgery may need to be rescheduled.    _x___ 1. Do not eat food after midnight the night before your procedure. You may drink clear liquids up to 2 hours before you are scheduled to arrive at the hospital for your procedure.  Do not drink clear liquids within 2 hours of your scheduled arrival to the hospital.  Clear liquids include  --Water or Apple juice without pulp  --Clear carbohydrate beverage such as ClearFast or Gatorade  --Black Coffee or Clear Tea (No milk, no creamers, do not add anything to  the coffee or Tea   No gum chewing or hard candies.     __x__ 2. No Alcohol for 24 hours before or after surgery.   __x__3. No Smoking for 24 prior to surgery.   ____  4. Bring all medications with you on the day of surgery if instructed.    __x__ 5. Notify your doctor if there is any change in your medical condition     (cold, fever, infections).     Do not wear jewelry, make-up, hairpins, clips or nail polish.  Do not wear lotions, powders, or perfumes. You may wear deodorant.  Do not shave 48 hours prior to surgery. Men may shave face and neck.  Do not bring valuables to the hospital.    [facility] is not responsible for any belongings or valuables.               Contacts, dentures or bridgework may not be worn into surgery.  Leave your suitcase in the car. After surgery it may be brought to your room.  For patients admitted to the hospital, discharge time is determined by your  treatment team.   Patients discharged the day  of surgery will not be allowed to drive home.  You will need someone to drive you home and stay with you the night of your procedure.    Please read over the following fact sheets that you were given:   [facility] Preparing for Surgery and or MRSA Information   _x___ TAKE THE FOLLOWING MEDICATION THE MORNING OF SURGERY WITH A SMALL SIP OF WATER. These include:  1. LYRICA  2.  3.  4.  5.  6.  ____Fleets enema or Magnesium Citrate as directed.   ____ Use CHG Soap or sage wipes as directed on instruction sheet   ____ Use inhalers on the day of surgery and bring to hospital day of surgery  ____ Stop Metformin and Janumet 2 days prior to surgery.    ____ Take 1/2 of usual insulin dose the night before surgery and none on the morning surgery.   ____ Follow recommendations from Cardiologist, Pulmonologist or PCP regarding  stopping Aspirin, Coumadin, Plavix ,Eliquis, Effient, or Pradaxa, and Pletal.  X____Stop Anti-inflammatories such as Advil, Aleve, Ibuprofen, Motrin, Naproxen, Naprosyn, Goodies powders or aspirin products NOW-OK to take Tylenol    ____ Stop supplements until after surgery.     ____ Bring C-Pap to the hospital.

## 2017-10-21 MED ORDER — CEFAZOLIN SODIUM-DEXTROSE 2-4 GM/100ML-% IV SOLN
2.0000 g | INTRAVENOUS | Status: AC
  Administered 2017-10-22: 2 g via INTRAVENOUS

## 2017-10-22 ENCOUNTER — Ambulatory Visit: Payer: BC Managed Care – PPO | Admitting: Certified Registered Nurse Anesthetist

## 2017-10-22 ENCOUNTER — Ambulatory Visit
Admission: RE | Admit: 2017-10-22 | Discharge: 2017-10-22 | Disposition: A | Discharge status: Home / Self Care | Payer: BC Managed Care – PPO | Source: Ambulatory Visit | Attending: General Surgery | Admitting: General Surgery

## 2017-10-22 ENCOUNTER — Encounter
Admission: RE | Disposition: A | Discharge status: Home / Self Care | Payer: Self-pay | Source: Ambulatory Visit | Attending: General Surgery

## 2017-10-22 ENCOUNTER — Encounter: Payer: Self-pay | Admitting: *Deleted

## 2017-10-22 ENCOUNTER — Other Ambulatory Visit: Payer: Self-pay

## 2017-10-22 DIAGNOSIS — Z85828 Personal history of other malignant neoplasm of skin: Secondary | ICD-10-CM | POA: Insufficient documentation

## 2017-10-22 DIAGNOSIS — Z79899 Other long term (current) drug therapy: Secondary | ICD-10-CM | POA: Insufficient documentation

## 2017-10-22 DIAGNOSIS — F419 Anxiety disorder, unspecified: Secondary | ICD-10-CM | POA: Diagnosis not present

## 2017-10-22 DIAGNOSIS — Z7982 Long term (current) use of aspirin: Secondary | ICD-10-CM | POA: Diagnosis not present

## 2017-10-22 DIAGNOSIS — N6310 Unspecified lump in the right breast, unspecified quadrant: Secondary | ICD-10-CM | POA: Insufficient documentation

## 2017-10-22 HISTORY — PX: EXCISION OF BREAST LESION: SHX6676

## 2017-10-22 SURGERY — EXCISION OF BREAST LESION
Anesthesia: General | Laterality: Right

## 2017-10-22 MED ORDER — FENTANYL CITRATE (PF) 100 MCG/2ML IJ SOLN
INTRAMUSCULAR | Status: DC | PRN
  Administered 2017-10-22 (×4): 25 ug via INTRAVENOUS

## 2017-10-22 MED ORDER — LIDOCAINE HCL (CARDIAC) 20 MG/ML IV SOLN
INTRAVENOUS | Status: DC | PRN
  Administered 2017-10-22: 40 mg via INTRAVENOUS

## 2017-10-22 MED ORDER — DEXAMETHASONE SODIUM PHOSPHATE 10 MG/ML IJ SOLN
INTRAMUSCULAR | Status: AC
  Filled 2017-10-22: qty 1

## 2017-10-22 MED ORDER — FAMOTIDINE 20 MG PO TABS
20.0000 mg | ORAL_TABLET | Freq: Once | ORAL | Status: AC
  Administered 2017-10-22: 20 mg via ORAL

## 2017-10-22 MED ORDER — TRAMADOL HCL 50 MG PO TABS
50.0000 mg | ORAL_TABLET | Freq: Four times a day (QID) | ORAL | Status: AC | PRN
  Administered 2017-10-22: 50 mg via ORAL

## 2017-10-22 MED ORDER — FENTANYL CITRATE (PF) 100 MCG/2ML IJ SOLN
INTRAMUSCULAR | Status: AC
  Filled 2017-10-22: qty 2

## 2017-10-22 MED ORDER — MIDAZOLAM HCL 2 MG/2ML IJ SOLN
INTRAMUSCULAR | Status: DC | PRN
  Administered 2017-10-22: 2 mg via INTRAVENOUS

## 2017-10-22 MED ORDER — PROPOFOL 10 MG/ML IV BOLUS
INTRAVENOUS | Status: AC
  Filled 2017-10-22: qty 20

## 2017-10-22 MED ORDER — PROPOFOL 500 MG/50ML IV EMUL
INTRAVENOUS | Status: AC
  Filled 2017-10-22: qty 100

## 2017-10-22 MED ORDER — BUPIVACAINE-EPINEPHRINE (PF) 0.5% -1:200000 IJ SOLN
INTRAMUSCULAR | Status: DC | PRN
  Administered 2017-10-22: 20 mL via PERINEURAL

## 2017-10-22 MED ORDER — ONDANSETRON HCL 4 MG/2ML IJ SOLN: 4.0000 mg | Freq: Once | INTRAMUSCULAR | Status: DC | PRN

## 2017-10-22 MED ORDER — PROPOFOL 500 MG/50ML IV EMUL
INTRAVENOUS | Status: DC | PRN
  Administered 2017-10-22: 130 ug/kg/min via INTRAVENOUS

## 2017-10-22 MED ORDER — ONDANSETRON HCL 4 MG/2ML IJ SOLN
INTRAMUSCULAR | Status: DC | PRN
  Administered 2017-10-22: 4 mg via INTRAVENOUS

## 2017-10-22 MED ORDER — CEFAZOLIN SODIUM-DEXTROSE 2-4 GM/100ML-% IV SOLN
INTRAVENOUS | Status: AC
  Filled 2017-10-22: qty 100

## 2017-10-22 MED ORDER — LACTATED RINGERS IV SOLN
INTRAVENOUS | Status: DC
  Administered 2017-10-22: 09:00:00 via INTRAVENOUS

## 2017-10-22 MED ORDER — FAMOTIDINE 20 MG PO TABS
ORAL_TABLET | ORAL | Status: AC
  Filled 2017-10-22: qty 1

## 2017-10-22 MED ORDER — ONDANSETRON HCL 4 MG/2ML IJ SOLN
INTRAMUSCULAR | Status: AC
  Filled 2017-10-22: qty 2

## 2017-10-22 MED ORDER — TRAMADOL HCL 50 MG PO TABS
ORAL_TABLET | ORAL | Status: AC
  Filled 2017-10-22: qty 1

## 2017-10-22 MED ORDER — FENTANYL CITRATE (PF) 100 MCG/2ML IJ SOLN
25.0000 ug | INTRAMUSCULAR | Status: DC | PRN
  Administered 2017-10-22 (×3): 25 ug via INTRAVENOUS

## 2017-10-22 MED ORDER — MIDAZOLAM HCL 2 MG/2ML IJ SOLN
INTRAMUSCULAR | Status: AC
  Filled 2017-10-22: qty 2

## 2017-10-22 MED ORDER — TRAMADOL HCL 50 MG PO TABS: 50.0000 mg | ORAL_TABLET | Freq: Four times a day (QID) | ORAL | 0 refills | Status: AC | PRN

## 2017-10-22 MED ORDER — DEXAMETHASONE SODIUM PHOSPHATE 10 MG/ML IJ SOLN
INTRAMUSCULAR | Status: DC | PRN
  Administered 2017-10-22: 10 mg via INTRAVENOUS

## 2017-10-22 MED ORDER — LIDOCAINE HCL (PF) 2 % IJ SOLN
INTRAMUSCULAR | Status: AC
  Filled 2017-10-22: qty 10

## 2017-10-22 MED ORDER — FENTANYL CITRATE (PF) 100 MCG/2ML IJ SOLN
INTRAMUSCULAR | Status: AC
  Administered 2017-10-22: 25 ug via INTRAVENOUS
  Filled 2017-10-22: qty 2

## 2017-10-22 MED ORDER — PROPOFOL 10 MG/ML IV BOLUS
INTRAVENOUS | Status: DC | PRN
  Administered 2017-10-22: 100 mg via INTRAVENOUS

## 2017-10-22 SURGICAL SUPPLY — 30 items
CANISTER SUCT 1200ML W/VALVE (MISCELLANEOUS) ×3 IMPLANT
CHLORAPREP W/TINT 26ML (MISCELLANEOUS) ×3 IMPLANT
CNTNR SPEC 2.5X3XGRAD LEK (MISCELLANEOUS) ×1
CONT SPEC 4OZ STER OR WHT (MISCELLANEOUS) ×2
CONTAINER SPEC 2.5X3XGRAD LEK (MISCELLANEOUS) ×1 IMPLANT
DERMABOND ADVANCED (GAUZE/BANDAGES/DRESSINGS) ×2
DERMABOND ADVANCED .7 DNX12 (GAUZE/BANDAGES/DRESSINGS) ×1 IMPLANT
DRAPE LAPAROTOMY 77X122 PED (DRAPES) ×3 IMPLANT
ELECT REM PT RETURN 9FT ADLT (ELECTROSURGICAL) ×3
ELECTRODE REM PT RTRN 9FT ADLT (ELECTROSURGICAL) ×1 IMPLANT
GLOVE BIO SURGEON STRL SZ 6.5 (GLOVE) ×2 IMPLANT
GLOVE BIO SURGEONS STRL SZ 6.5 (GLOVE) ×1
GOWN STRL REUS W/ TWL LRG LVL3 (GOWN DISPOSABLE) ×3 IMPLANT
GOWN STRL REUS W/TWL LRG LVL3 (GOWN DISPOSABLE) ×6
KIT RM TURNOVER STRD PROC AR (KITS) ×3 IMPLANT
LABEL OR SOLS (LABEL) ×3 IMPLANT
MARGIN MAP 10MM (MISCELLANEOUS) ×3 IMPLANT
NEEDLE HYPO 25X1 1.5 SAFETY (NEEDLE) ×6 IMPLANT
PACK BASIN MINOR ARMC (MISCELLANEOUS) ×3 IMPLANT
SUCTION FRAZIER HANDLE 10FR (MISCELLANEOUS) ×2
SUCTION TUBE FRAZIER 10FR DISP (MISCELLANEOUS) ×1 IMPLANT
SUT ETHILON 3 0 FSLX (SUTURE) ×3 IMPLANT
SUT ETHILON 3-0 FS-10 30 BLK (SUTURE) ×3
SUT MNCRL 4-0 (SUTURE) ×2
SUT MNCRL 4-0 27XMFL (SUTURE) ×1
SUT VIC AB 4-0 PS2 18 (SUTURE) ×3 IMPLANT
SUTURE EHLN 3-0 FS-10 30 BLK (SUTURE) ×1 IMPLANT
SUTURE MNCRL 4-0 27XMF (SUTURE) ×1 IMPLANT
SYR 10ML LL (SYRINGE) ×3 IMPLANT
WATER STERILE IRR 1000ML POUR (IV SOLUTION) ×3 IMPLANT

## 2017-10-22 NOTE — Anesthesia Post-op Follow-up Note (Signed)
Anesthesia QCDR form completed.        

## 2017-10-22 NOTE — Op Note (Signed)
Preoperative diagnosis: Right breast mass.  Postoperative diagnosis: Right breast mass.  Procedure: Right excisional biopsy.  Anesthesia: General (LMA)  Surgeon: Dr. Windell Moment  Wound Classification: Clean  Indications:  Patient is a 46 y.o. female with a palpable right breast mass underwent workup with ultrasound, diagnostic mammogram and and all came negative. Mass was causing anxiety to patient and was requested by the patient due to fear of cancer.   Findings: 1. Palpable mass at right upper quadrant 2. No other masses or nodes palpated around the area or on the ipsilateral axilla.  3. Adequate hemostasis.   Description of procedure: The patient was taken to the operating room and placed supine on the operating table, and after general anesthesia was administered, the right chest and axilla were prepped and draped in the usual sterile fashion. A time-out was completed verifying correct patient, procedure, site, positioning, and implant(s) and/or special equipment prior to beginning this procedure.  A circumareolar skin incision incision was planned adjacent to the palpable mass. Local anesthesia was infiltrated and a skin incision was made. Flaps were raised and the location of the mass confirmed by palpation.   The mass was dissected from surrounding tissues using electrocautery.  After removing the lump, the cavity was palpated and no additional abnormalities was palpated. The specimen was oriented and submitted to pathology.  Hemostasis was achieved with electrocautery and suture ligatures of 3-0 Vicryl.  No attempt was made to close the dead space. The skin was closed with a subcuticular suture of running monocryl 4-0. A dressing was applied.  The patient tolerated the procedure well and was taken to the postanesthesia care unit in stable condition.   Specimen: Right breast mass  Complications: None  Estimated Blood Loss: 81mL

## 2017-10-22 NOTE — Anesthesia Preprocedure Evaluation (Signed)
Anesthesia Evaluation  Patient identified by MRN, date of birth, ID band Patient awake    Reviewed: Allergy & Precautions, H&P , NPO status , Patient's Chart, lab work & pertinent test results, reviewed documented beta blocker date and time   History of Anesthesia Complications (+) PONV and history of anesthetic complications  Airway Mallampati: II  TM Distance: >3 FB Neck ROM: full    Dental  (+) Teeth Intact   Pulmonary neg pulmonary ROS,    Pulmonary exam normal        Cardiovascular Exercise Tolerance: Good negative cardio ROS Normal cardiovascular exam Rate:Normal     Neuro/Psych  Headaches, PSYCHIATRIC DISORDERS TIAnegative neurological ROS  negative psych ROS   GI/Hepatic negative GI ROS, Neg liver ROS,   Endo/Other  negative endocrine ROS  Renal/GU negative Renal ROS  negative genitourinary   Musculoskeletal   Abdominal   Peds  Hematology negative hematology ROS (+) anemia ,   Anesthesia Other Findings   Reproductive/Obstetrics negative OB ROS                             Anesthesia Physical Anesthesia Plan  ASA: II  Anesthesia Plan: General LMA   Post-op Pain Management:    Induction:   PONV Risk Score and Plan: 4 or greater and Dexamethasone, Ondansetron, Propofol infusion and Midazolam  Airway Management Planned:   Additional Equipment:   Intra-op Plan:   Post-operative Plan:   Informed Consent: I have reviewed the patients History and Physical, chart, labs and discussed the procedure including the risks, benefits and alternatives for the proposed anesthesia with the patient or authorized representative who has indicated his/her understanding and acceptance.     Plan Discussed with: CRNA  Anesthesia Plan Comments:         Anesthesia Quick Evaluation

## 2017-10-22 NOTE — Discharge Instructions (Signed)
°  Diet: Resume home heart healthy regular diet.   Activity: Light activity and walking are encouraged. Do not drive or drink alcohol if taking narcotic pain medications.  Wound care: May shower with soapy water and pat dry (do not rub incisions) starting tomorrow, but no baths or submerging incision underwater until follow-up. (no swimming)   Medications: Resume all home medications. For mild to moderate pain: acetaminophen (Tylenol) or ibuprofen (if no kidney disease). Combining Tylenol with alcohol can substantially increase your risk of causing liver disease. Narcotic pain medications, if prescribed, can be used for severe pain, though may cause nausea, constipation, and drowsiness. Do not combine Tylenol and Percocet within a 6 hour period as Percocet contains Tylenol. If you do not need the narcotic pain medication, you do not need to fill the prescription.      AMBULATORY SURGERY  DISCHARGE INSTRUCTIONS   1) The drugs that you were given will stay in your system until tomorrow so for the next 24 hours you should not:  A) Drive an automobile B) Make any legal decisions C) Drink any alcoholic beverage   2) You may resume regular meals tomorrow.  Today it is better to start with liquids and gradually work up to solid foods.  You may eat anything you prefer, but it is better to start with liquids, then soup and crackers, and gradually work up to solid foods.   3) Please notify your doctor immediately if you have any unusual bleeding, trouble breathing, redness and pain at the surgery site, drainage, fever, or pain not relieved by medication.    4) Additional Instructions:        Please contact your physician with any problems or Same Day Surgery at [phone], Monday through Friday 6 am to 4 pm, or [place] at [facility] number at [phone].  Call office [phone]) at any time if any questions, worsening pain, fevers/chills, bleeding, drainage from incision  site, or other concerns.

## 2017-10-22 NOTE — Transfer of Care (Signed)
Immediate Anesthesia Transfer of Care Note  Patient: Jeanette Lang  Procedure(s) Performed: EXCISION OF BREAST MASS (Right )  Patient Location: PACU  Anesthesia Type:General  Level of Consciousness: awake, oriented, drowsy and patient cooperative  Airway & Oxygen Therapy: Patient Spontanous Breathing and Patient connected to nasal cannula oxygen  Post-op Assessment: Report given to RN and Post -op Vital signs reviewed and stable  Post vital signs: Reviewed and stable  Last Vitals:  Vitals:   10/22/17 0850 10/22/17 1053  BP: (!) 148/94 126/79  Pulse: (!) 103 (!) 128  Resp: 16 (!) 24  Temp: 36.4 C 36.4 C  SpO2: 99% 99%    Last Pain:  Vitals:   10/22/17 0850  TempSrc: Oral         Complications: No apparent anesthesia complications

## 2017-10-22 NOTE — Anesthesia Procedure Notes (Signed)
Procedure Name: LMA Insertion Date/Time: 10/22/2017 9:37 AM Performed by: Eben Burow, CRNA Pre-anesthesia Checklist: Patient identified, Emergency Drugs available, Suction available, Patient being monitored and Timeout performed Patient Re-evaluated:Patient Re-evaluated prior to induction Oxygen Delivery Method: Circle system utilized Preoxygenation: Pre-oxygenation with 100% oxygen Induction Type: IV induction Ventilation: Mask ventilation without difficulty LMA: LMA inserted LMA Size: 3.0 Number of attempts: 1 Placement Confirmation: positive ETCO2 and breath sounds checked- equal and bilateral Tube secured with: Tape Dental Injury: Teeth and Oropharynx as per pre-operative assessment

## 2017-10-22 NOTE — Interval H&P Note (Signed)
History and Physical Interval Note:  10/22/2017 9:14 AM  Jeanette Lang  has presented today for surgery, with the diagnosis of RIGHT BREAST MASS  The various methods of treatment have been discussed with the patient and family. After consideration of risks, benefits and other options for treatment, the patient has consented to  Procedure(s): EXCISION OF BREAST MASS (Right) as a surgical intervention .  The patient's history has been reviewed, patient examined, no change in status, stable for surgery.  I have reviewed the patient's chart and labs.  Palpable mass marked with patient on the pre procedure room. Questions were answered to the patient's satisfaction.     Herbert Pun

## 2017-10-22 NOTE — Anesthesia Postprocedure Evaluation (Signed)
Anesthesia Post Note  Patient: Jeanette Lang  Procedure(s) Performed: EXCISION OF BREAST MASS (Right )  Patient location during evaluation: PACU Anesthesia Type: General Level of consciousness: awake and alert Pain management: pain level controlled Vital Signs Assessment: post-procedure vital signs reviewed and stable Respiratory status: spontaneous breathing, nonlabored ventilation, respiratory function stable and patient connected to nasal cannula oxygen Cardiovascular status: blood pressure returned to baseline and stable Postop Assessment: no apparent nausea or vomiting Anesthetic complications: no     Last Vitals:  Vitals:   10/22/17 1154 10/22/17 1213  BP: 125/68 123/66  Pulse: 82 84  Resp: 18   Temp: 36.7 C   SpO2: 99% 100%    Last Pain:  Vitals:   10/22/17 1218  TempSrc:   PainSc: 4                  Molli Barrows

## 2017-10-23 ENCOUNTER — Encounter: Payer: Self-pay | Admitting: General Surgery

## 2017-10-23 LAB — SURGICAL PATHOLOGY

## 2017-11-19 ENCOUNTER — Other Ambulatory Visit: Payer: Self-pay

## 2017-11-19 MED ORDER — OSELTAMIVIR PHOSPHATE 75 MG PO CAPS: 75.0000 mg | ORAL_CAPSULE | Freq: Two times a day (BID) | ORAL | 0 refills | Status: DC

## 2017-11-19 MED ORDER — AZITHROMYCIN 250 MG PO TABS: 250.0000 mg | ORAL_TABLET | Freq: Every day | ORAL | 0 refills | Status: DC

## 2017-11-23 ENCOUNTER — Emergency Department: Payer: BC Managed Care – PPO

## 2017-11-23 ENCOUNTER — Encounter: Payer: Self-pay | Admitting: Emergency Medicine

## 2017-11-23 ENCOUNTER — Emergency Department
Admission: EM | Admit: 2017-11-23 | Discharge: 2017-11-23 | Disposition: A | Discharge status: Home / Self Care | Payer: BC Managed Care – PPO | Attending: Emergency Medicine | Admitting: Emergency Medicine

## 2017-11-23 DIAGNOSIS — Z79899 Other long term (current) drug therapy: Secondary | ICD-10-CM | POA: Diagnosis not present

## 2017-11-23 DIAGNOSIS — R0602 Shortness of breath: Secondary | ICD-10-CM | POA: Insufficient documentation

## 2017-11-23 DIAGNOSIS — Z85828 Personal history of other malignant neoplasm of skin: Secondary | ICD-10-CM | POA: Insufficient documentation

## 2017-11-23 DIAGNOSIS — Z7982 Long term (current) use of aspirin: Secondary | ICD-10-CM | POA: Insufficient documentation

## 2017-11-23 DIAGNOSIS — Z8673 Personal history of transient ischemic attack (TIA), and cerebral infarction without residual deficits: Secondary | ICD-10-CM | POA: Diagnosis not present

## 2017-11-23 DIAGNOSIS — R0789 Other chest pain: Secondary | ICD-10-CM | POA: Diagnosis present

## 2017-11-23 LAB — BASIC METABOLIC PANEL
ANION GAP: 10 (ref 5–15)
BUN: 9 mg/dL (ref 6–20)
CHLORIDE: 101 mmol/L (ref 101–111)
CO2: 26 mmol/L (ref 22–32)
Calcium: 9.2 mg/dL (ref 8.9–10.3)
Creatinine, Ser: 0.83 mg/dL (ref 0.44–1.00)
Glucose, Bld: 93 mg/dL (ref 65–99)
POTASSIUM: 3.7 mmol/L (ref 3.5–5.1)
SODIUM: 137 mmol/L (ref 135–145)

## 2017-11-23 LAB — TROPONIN I: Troponin I: <0.03 ng/mL (ref <0.03)

## 2017-11-23 LAB — CBC
HEMATOCRIT: 40.7 % (ref 35.0–47.0)
Hemoglobin: 14 g/dL (ref 12.0–16.0)
MCH: 30.3 pg (ref 26.0–34.0)
MCHC: 34.4 g/dL (ref 32.0–36.0)
MCV: 88.2 fL (ref 80.0–100.0)
Platelets: 224 10*3/uL (ref 150–440)
RBC: 4.62 MIL/uL (ref 3.80–5.20)
RDW: 12.2 % (ref 11.5–14.5)
WBC: 6.9 10*3/uL (ref 3.6–11.0)

## 2017-11-23 IMAGING — MG MM DIGITAL SCREENING BILAT W/ CAD
4 series · 4 of 4 positions shown · non-contrast
Comparison: Previous exam(s).

CLINICAL DATA: Screening.

EXAM:
DIGITAL SCREENING BILATERAL MAMMOGRAM WITH CAD

[R MLO]
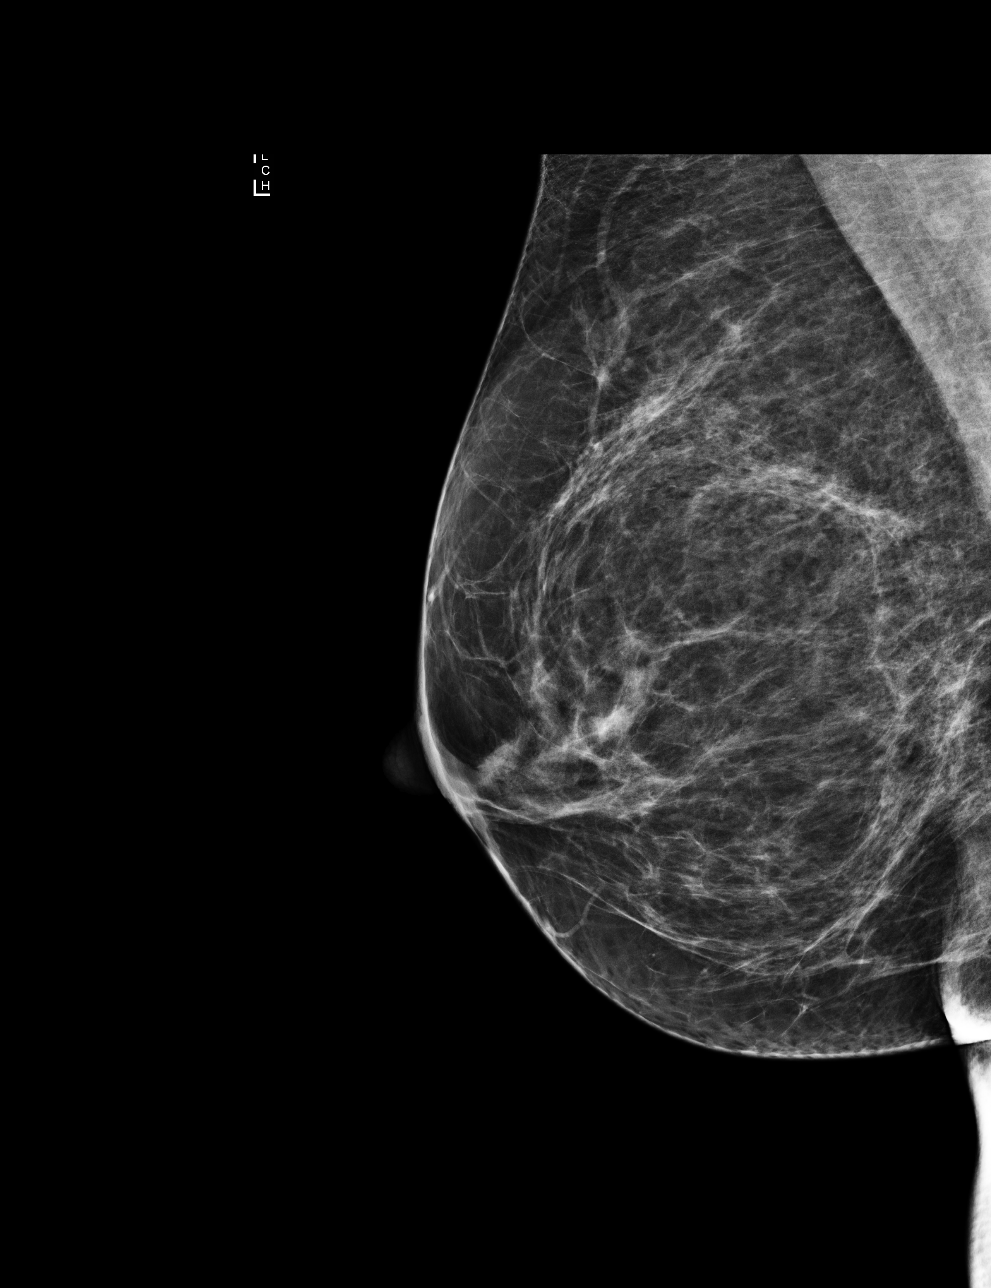

[L CC]
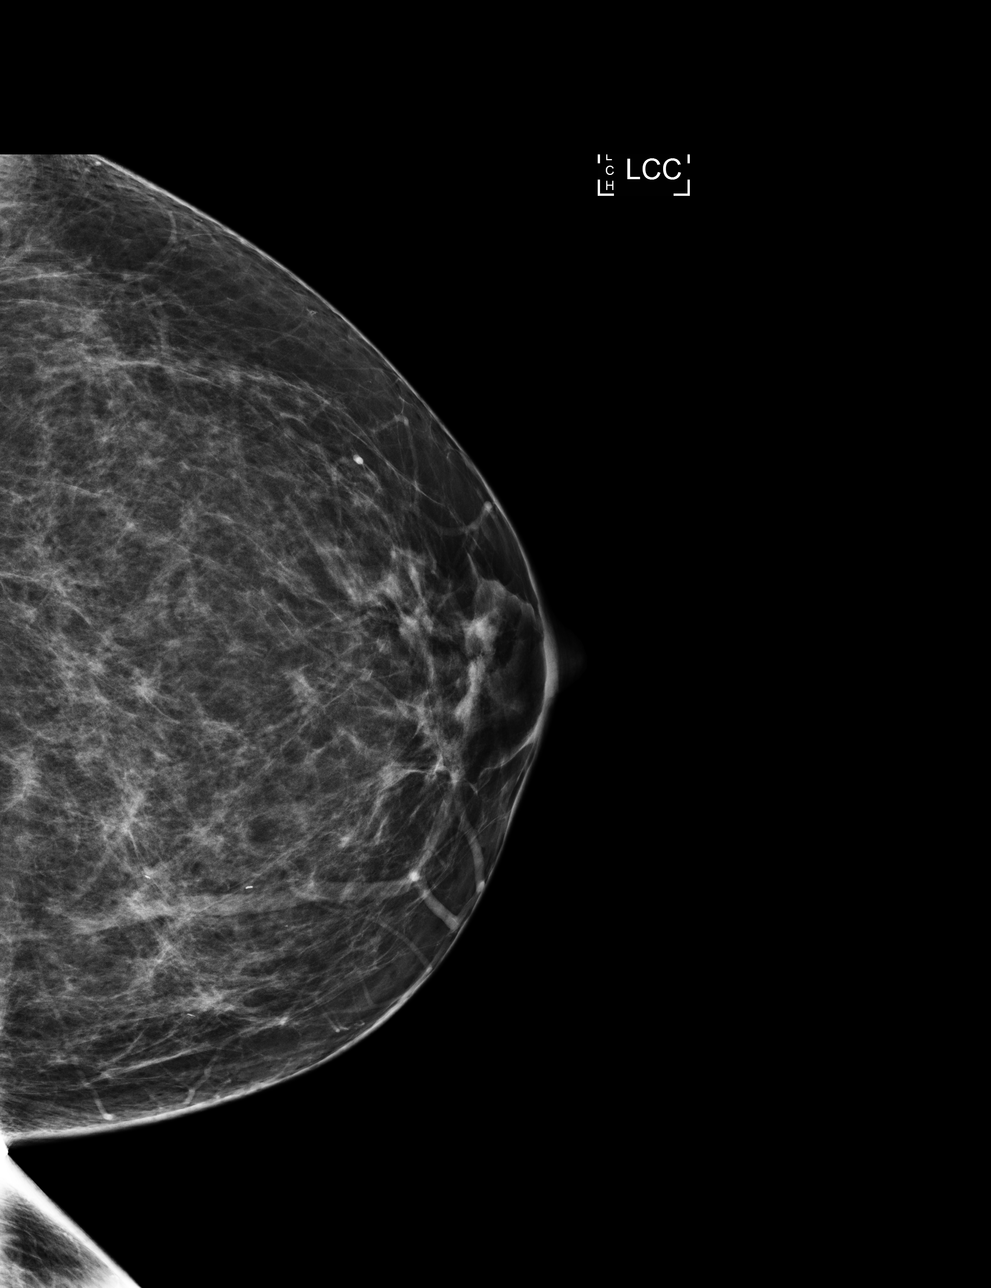

[L MLO]
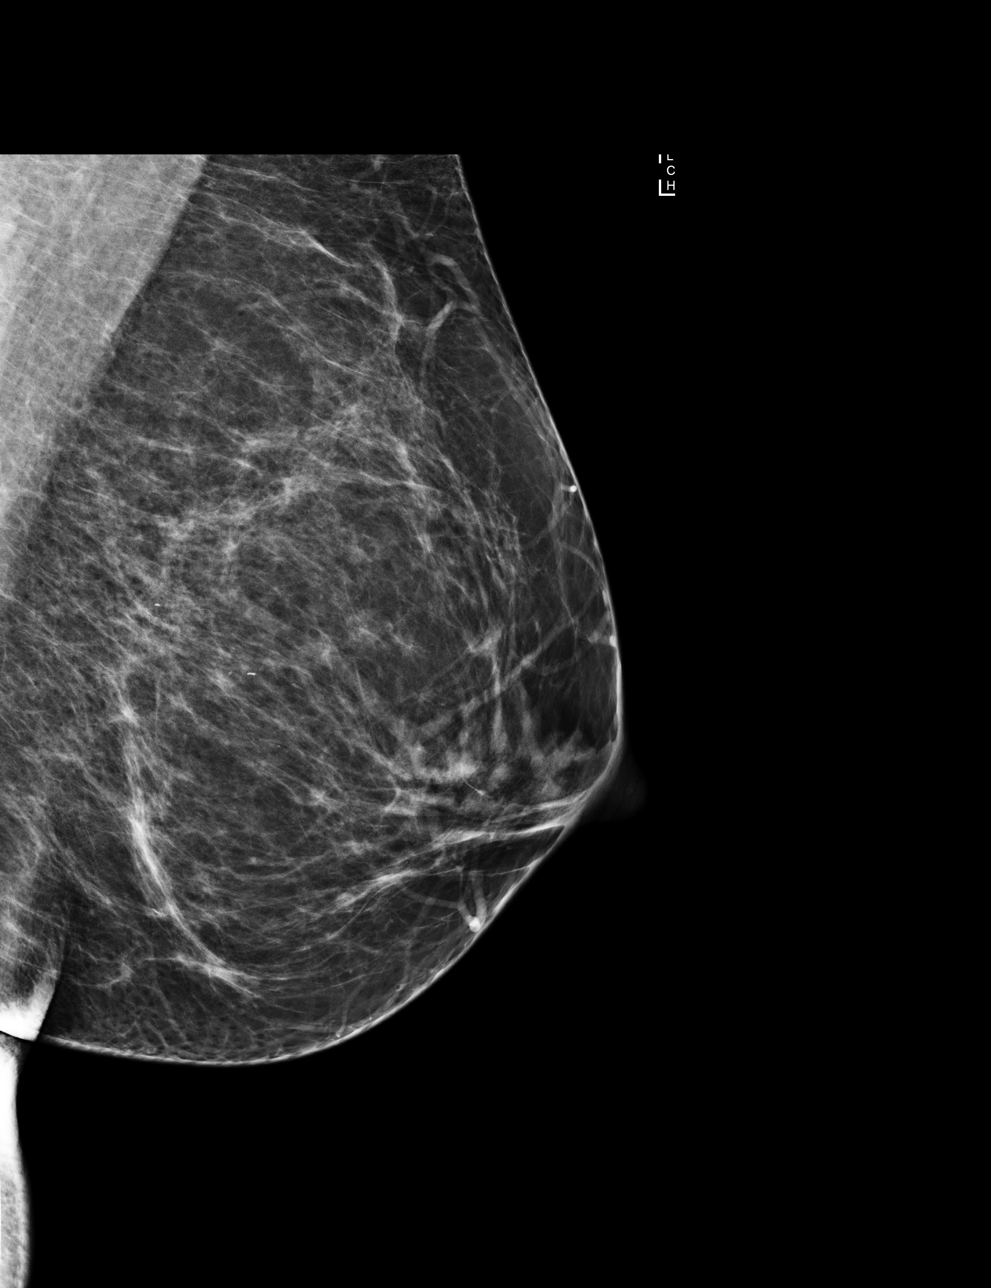

[R CC]
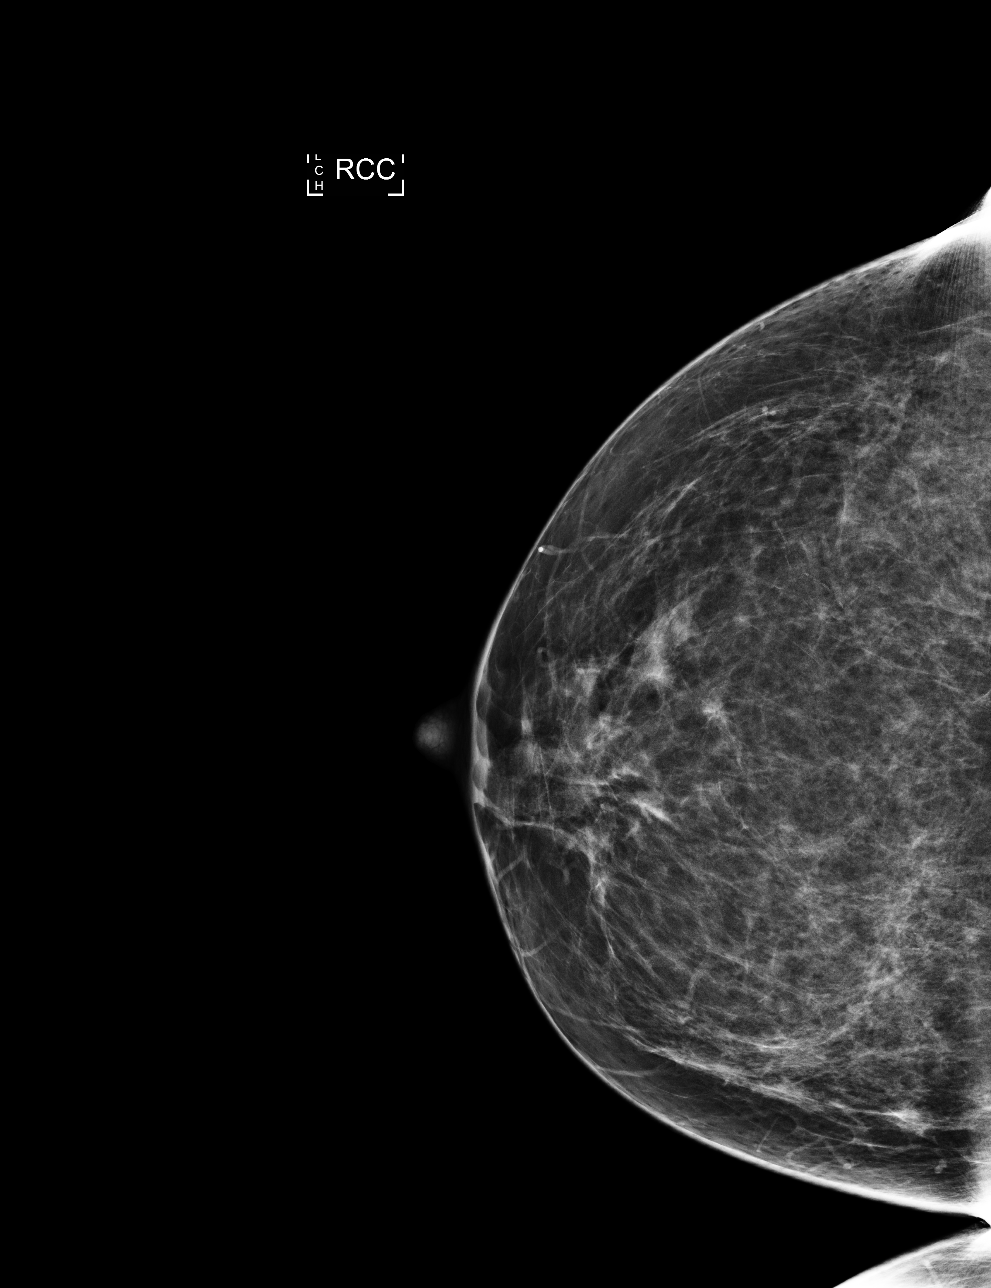

[4 of 4 positions shown; findings below may reference images not displayed]

ACR Breast Density Category b: There are scattered areas of
fibroglandular density.
FINDINGS: There are no findings suspicious for malignancy. Images were
processed with CAD.
IMPRESSION: No mammographic evidence of malignancy. A result letter of this
screening mammogram will be mailed directly to the patient.

RECOMMENDATION:
Screening mammogram in one year. (Code:AS-G-LCT)

BI-RADS CATEGORY  1: Negative.

## 2017-11-23 MED ORDER — FAMOTIDINE 20 MG PO TABS
20.0000 mg | ORAL_TABLET | Freq: Once | ORAL | Status: AC
  Administered 2017-11-23: 20 mg via ORAL

## 2017-11-23 MED ORDER — ALUM & MAG HYDROXIDE-SIMETH 200-200-20 MG/5ML PO SUSP
30.0000 mL | Freq: Once | ORAL | Status: AC
  Administered 2017-11-23: 30 mL via ORAL

## 2017-11-23 MED ORDER — ALUM & MAG HYDROXIDE-SIMETH 200-200-20 MG/5ML PO SUSP
ORAL | Status: AC
  Administered 2017-11-23: 30 mL via ORAL
  Filled 2017-11-23: qty 30

## 2017-11-23 MED ORDER — GLUCAGON HCL RDNA (DIAGNOSTIC) 1 MG IJ SOLR
INTRAMUSCULAR | Status: AC
  Administered 2017-11-23: 1 mg via INTRAMUSCULAR
  Filled 2017-11-23: qty 1

## 2017-11-23 MED ORDER — FAMOTIDINE 20 MG PO TABS
ORAL_TABLET | ORAL | Status: AC
  Administered 2017-11-23: 20 mg via ORAL
  Filled 2017-11-23: qty 1

## 2017-11-23 MED ORDER — SUCRALFATE 1 G PO TABS: 1.0000 g | ORAL_TABLET | Freq: Four times a day (QID) | ORAL | 0 refills | Status: AC

## 2017-11-23 MED ORDER — GLUCAGON HCL (RDNA) 1 MG IJ SOLR
1.0000 mg | Freq: Once | INTRAMUSCULAR | Status: AC
  Administered 2017-11-23: 1 mg via INTRAMUSCULAR

## 2017-11-23 NOTE — ED Triage Notes (Signed)
Pt in via POV with complaints of generalized chest pain w/ shortness of breath since Friday, states pain is worse after she eats, producing a heaviness in her chest.  Pt reports associated nausea, pt states, "I just feel really off."  Vitals WDL, NAD noted at this time.

## 2017-11-23 NOTE — ED Notes (Signed)
Pt discharged to home.  Family member driving.  Discharge instructions reviewed.  Verbalized understanding.  No questions or concerns at this time.  Teach back verified.  Pt in NAD.  No items left in ED.   

## 2017-11-23 NOTE — Discharge Instructions (Signed)
You should call Dr. Vicente Males first thing tomorrow morning as instructed.  You should inform the receptionist that you were seen in the emergency department today, that we spoke to Dr. Vicente Males, and that Dr. Vicente Males specifically requested that you be scheduled for appointment within 1-2 days.  In the meantime take the Carafate as prescribed.  Return to the emergency department immediately for new, worsening, or persistent chest pain, difficulty swallowing, regurgitation or vomiting, difficulty breathing, weakness or lightheadedness, or any other new or worsening symptoms that concern you.

## 2017-11-23 NOTE — ED Provider Notes (Signed)
[facility] Emergency Department Provider Note ____________________________________________   First MD Initiated Contact with Patient 11/23/17 2056     (approximate)  I have reviewed the triage vital signs and the nursing notes.   HISTORY  Chief Complaint Chest Pain    HPI Jeanette Lang is a 47 y.o. female with past medical history as noted below who presents with chest pain, occurring over the last 3 days, and intermittent.  She describes it is located mainly in the lower substernal area and into the epigastric region, and nonradiating.  It is particularly worse after trying to eat or drink, when she feels an intense pressure for several minutes and then it resolves.  Patient reports associated shortness of breath, but denies lightheadedness.  There is no cough, fever, or vomiting.  No prior history of this symptom.  Patient states that earlier in the week she had sinus pressure and congestion, but this resolved after antibiotics.  Past Medical History:  Diagnosis Date  . Anemia    WITH PREGANCY ONLY  . Cancer (HCC)    BASAL CELL-BACK  . Complication of anesthesia    HARD TO WAKE UP AFTER HYSTERECTOMY  . Headache    MIGRAINES  . MS (multiple sclerosis) ([place])    PT STATES MRI SHOWED WHITE MATTER CHANGES AND LESION AND NEUROLGIST IS TREATING HER FOR MS-PT SEES DR DOUGLAS JEFFRIES, NEUROLOGIST AT [facility]  . Numbness and tingling    TO LEFT SIDE DAILY-SEES NEUROLOGIST  . PONV (postoperative nausea and vomiting)    NAUSEA ONLY  . TIA (transient ischemic attack) 06/2016  . Vision abnormalities   . White matter abnormality on MRI of brain    AND LESION PER PT-PT STATES HER NEUROLOGIST IS TREATING HER FOR MS-SEES DRDOUGLAS JEFFREY AT [facility]    Patient Active Problem List   Diagnosis Date Noted  . Cognitive disorder 03/11/2017  . Numbness 03/11/2017  . Snoring 03/11/2017  . Excessive daytime sleepiness 03/11/2017  . Classic migraine  03/11/2017  . Positive ANA (antinuclear antibody) 03/11/2017  . Depression 03/11/2017  . Complicated migraine 54/27/0623  . Left-sided weakness   . TIA (transient ischemic attack) 07/24/2016    Past Surgical History:  Procedure Laterality Date  . ABDOMINAL HYSTERECTOMY  2012   complete  . APPENDECTOMY    . EXCISION OF BREAST LESION Right 10/22/2017   Procedure: EXCISION OF BREAST MASS;  Surgeon: Herbert Pun, MD;  Location: [facility] ORS;  Service: General;  Laterality: Right;    Prior to Admission medications   Medication Sig Start Date End Date Taking? Authorizing Provider  amphetamine-dextroamphetamine (ADDERALL) 20 MG tablet Take 20 mg by mouth 2 (two) times daily.     [provider]  aspirin EC 81 MG tablet Take 1 tablet (81 mg total) by mouth daily. 07/25/16   Theodoro Grist, MD  azithromycin (ZITHROMAX Z-PAK) 250 MG tablet Take 1 tablet (250 mg total) by mouth daily. Take as directed for 6 days 11/19/17   Ronnell Freshwater, NP  Calcium Carbonate-Vitamin D (CALTRATE 600+D PO) Take 1 tablet by mouth daily.    [provider]  estradiol (ESTRACE) 2 MG tablet Take 2 mg by mouth daily.    [provider]  Multiple Vitamin (MULTIVITAMIN) tablet Take 1 tablet by mouth daily.    [provider]  oseltamivir (TAMIFLU) 75 MG capsule Take 1 capsule (75 mg total) by mouth 2 (two) times daily. For 5 days 11/19/17   Ronnell Freshwater,  NP  pregabalin (LYRICA) 50 MG capsule Take 50 mg by mouth 2 (two) times daily. SUPPOSED TO TAKE BID BUT FORGETS 2ND DOSE SOMETIMES-TAKES AM DOSE DAILY    [provider]  SUMAtriptan (IMITREX) 50 MG tablet Take 50 mg by mouth every 2 (two) hours as needed for migraine. May repeat in 2 hours if headache persists or recurs.    [provider]  Topiramate ER (TROKENDI XR) 100 MG CP24 Take 1 capsule by mouth daily. 10/02/17   [provider]    Allergies Codeine; Gabapentin; and Tramadol  Family  History  Problem Relation Age of Onset  . CVA Neg Hx     Social History Social History   Tobacco Use  . Smoking status: Never Smoker  . Smokeless tobacco: Never Used  Substance Use Topics  . Alcohol use: Yes    Comment: OCC SIP OF WINE RARE  . Drug use: No    Review of Systems  Constitutional: No fever/chills. Eyes: No visual changes. ENT: No sore throat. Cardiovascular: Positive for chest pain. Respiratory: Positive for intermittent shortness of breath. Gastrointestinal: No nausea, no vomiting.  No diarrhea.  Genitourinary: Negative for dysuria.  Musculoskeletal: Negative for back pain. Skin: Negative for rash. Neurological: Negative for headache.   ____________________________________________   PHYSICAL EXAM:  VITAL SIGNS: ED Triage Vitals  Enc Vitals Group     BP 11/23/17 1825 (!) 146/92     Pulse Rate 11/23/17 1825 90     Resp 11/23/17 1825 18     Temp 11/23/17 1825 97.9 F (36.6 C)     Temp Source 11/23/17 1825 Oral     SpO2 11/23/17 1825 100 %     Weight 11/23/17 1826 107 lb (48.5 kg)     Height 11/23/17 1826 4\' 11"  (1.499 m)     Head Circumference --      Peak Flow --      Pain Score 11/23/17 1826 6     Pain Loc --      Pain Edu? --      Excl. in [place]? --     Constitutional: Alert and oriented. Well appearing and in no acute distress. Eyes: Conjunctivae are normal.  Head: Atraumatic. Nose: No congestion/rhinnorhea. Mouth/Throat: Mucous membranes are moist.   Neck: Normal range of motion.  Cardiovascular: Normal rate, regular rhythm. Grossly normal heart sounds.  Good peripheral circulation.  Chest wall nontender. Respiratory: Normal respiratory effort.  No retractions. Lungs CTAB. Gastrointestinal: Soft and nontender. No distention.  Genitourinary: No flank tenderness. Musculoskeletal: No lower extremity edema.  No calf or popliteal swelling or tenderness.  Extremities warm and well perfused.  Neurologic:  Normal speech and language. No gross  focal neurologic deficits are appreciated.  Skin:  Skin is warm and dry. No rash noted. Psychiatric: Mood and affect are normal. Speech and behavior are normal.  ____________________________________________   LABS (all labs ordered are listed, but only abnormal results are displayed)  Labs Reviewed  BASIC METABOLIC PANEL  CBC  TROPONIN I  TROPONIN I   ____________________________________________  EKG  ED ECG REPORT I, Arta Silence, the attending physician, personally viewed and interpreted this ECG.  Date: 11/23/2017 EKG Time: 1821 Rate: 94 Rhythm: normal sinus rhythm QRS Axis: normal Intervals: Borderline short PR ST/T Wave abnormalities: normal Narrative Interpretation: no evidence of acute ischemia  ____________________________________________  RADIOLOGY  CXR: No focal infiltrates or other acute findings  ____________________________________________   PROCEDURES  Procedure(s) performed: No    Critical Care performed:  No ____________________________________________   INITIAL IMPRESSION / ASSESSMENT AND PLAN / ED COURSE  Pertinent labs & imaging results that were available during my care of the patient were reviewed by me and considered in my medical decision making (see chart for details).  47 year old female with past medical history as noted above, who is also currently being worked up for possible MS, presents with chest pain over the last 3 days, which is intermittent, pressure-like and substernal, and mostly associated with eating.  I reviewed the past medical records in Epic and they are otherwise noncontributory except for PMH as noted above.  On exam, the patient is well-appearing, the vital signs are normal except for mild hypertension, and the remainder the exam is unremarkable.  EKG and initial lab workup are also within normal limits.  Overall presentation is most concerning for esophageal spasm or other esophageal etiology versus GERD.   I have a lower suspicion for musculoskeletal cause.  Patient has no ACS risk factors, and given the atypical nature of the pain in the normal EKG I have a low suspicion for ACS.  Plan for troponin x2 to rule out.  No clinical evidence to support PE, and patient has no signs or symptoms of DVT.  No clinical evidence for vascular etiology.  Plan: Second troponin, GI medication, and reassess.  I will also consult GI.    ----------------------------------------- 9:51 PM on 11/23/2017 -----------------------------------------  Repeat troponin is negative.  After taking Pepcid with some water, patient had a recurrence of the symptoms that lasted for a few minutes with the same lower chest substernal area pain, which subsequently resolved.  Patient confirmed that she is not having any difficulty swallowing, and that she has not regurgitated or vomited at all during the period of the symptoms.  I consulted Dr. Vicente Males from GI.  He agrees that patient should follow-up with him within the next few days.  He states that she should get an endoscopy.  He instructed me to start the patient on Carafate, and to instruct her to call his office tomorrow morning to arrange for follow-up within the next 1-2 days and urgent endoscopy.  On reassessment, the patient is comfortable, and feels well to go home.  I explained the discharge plan and instructions to her as well as the likely diagnoses.  I also explained return precautions and she expressed understanding.  ____________________________________________   FINAL CLINICAL IMPRESSION(S) / ED DIAGNOSES  Final diagnoses:  Atypical chest pain      NEW MEDICATIONS STARTED DURING THIS VISIT:  New Prescriptions   No medications on file     Note:  This document was prepared using Dragon voice recognition software and may include unintentional dictation errors.    Arta Silence, MD 11/23/17 2153

## 2017-11-27 ENCOUNTER — Ambulatory Visit: Payer: Self-pay | Admitting: Internal Medicine

## 2017-11-27 DIAGNOSIS — Z8371 Family history of colonic polyps: Secondary | ICD-10-CM | POA: Insufficient documentation

## 2018-01-07 ENCOUNTER — Ambulatory Visit: Payer: BC Managed Care – PPO | Admitting: Nurse Practitioner

## 2018-01-07 ENCOUNTER — Other Ambulatory Visit: Payer: Self-pay | Admitting: Nurse Practitioner

## 2018-01-07 ENCOUNTER — Encounter: Payer: Self-pay | Admitting: Nurse Practitioner

## 2018-01-07 VITALS — BP 127/90 | HR 97 | Temp 97.8°F | Resp 16 | Ht 59.0 in | Wt 103.0 lb

## 2018-01-07 DIAGNOSIS — R9082 White matter disease, unspecified: Secondary | ICD-10-CM | POA: Diagnosis not present

## 2018-01-07 DIAGNOSIS — J069 Acute upper respiratory infection, unspecified: Secondary | ICD-10-CM | POA: Diagnosis not present

## 2018-01-07 MED ORDER — AZITHROMYCIN 250 MG PO TABS: 250.0000 mg | ORAL_TABLET | Freq: Every day | ORAL | 0 refills | Status: DC

## 2018-01-07 NOTE — Progress Notes (Signed)
[facility] [place], [place] [address]  Internal MEDICINE  Office Visit Note  Patient Name: Jeanette Lang  [id number]  [id number]  Date of Service: 01/27/2018  Chief Complaint  Patient presents with  . Cough  . Sinusitis  . Headache  . Fatigue     The patient is here for sick visit. Today, she is having headache, cough, fatigue, and sinus congestion. Symptoms have been present for past several days. She has taken tylenol and mucinex to help symptoms, but ha had no relief.   Pt is here for a sick visit.     Current Medication:  Outpatient Encounter Medications as of 01/07/2018  Medication Sig Note  . amphetamine-dextroamphetamine (ADDERALL) 20 MG tablet Take 20 mg by mouth 2 (two) times daily.    . Calcium Carbonate-Vitamin D (CALTRATE 600+D PO) Take 1 tablet by mouth daily.   Marland Kitchen estradiol (ESTRACE) 2 MG tablet Take 2 mg by mouth daily.   . Multiple Vitamin (MULTIVITAMIN) tablet Take 1 tablet by mouth daily.   . pregabalin (LYRICA) 50 MG capsule Take 50 mg by mouth 2 (two) times daily. SUPPOSED TO TAKE BID BUT FORGETS 2ND DOSE SOMETIMES-TAKES AM DOSE DAILY   . SUMAtriptan (IMITREX) 50 MG tablet Take 50 mg by mouth every 2 (two) hours as needed for migraine. May repeat in 2 hours if headache persists or recurs.   Marland Kitchen aspirin EC 81 MG tablet Take 1 tablet (81 mg total) by mouth daily. (Patient not taking: Reported on 01/07/2018) 10/16/2017: ON HOLD  . azithromycin (ZITHROMAX Z-PAK) 250 MG tablet Take 1 tablet (250 mg total) by mouth daily. Take as directed for 6 days   . omeprazole (PRILOSEC) 40 MG capsule    . oseltamivir (TAMIFLU) 75 MG capsule Take 1 capsule (75 mg total) by mouth 2 (two) times daily. For 5 days (Patient not taking: Reported on 01/07/2018)   . sucralfate (CARAFATE) 1 g tablet Take 1 tablet (1 g total) by mouth 4 (four) times daily for 15 days.   . Topiramate ER (TROKENDI XR) 100 MG CP24 Take 1 capsule by mouth daily. 10/16/2017: ON HOLD   . [DISCONTINUED] azithromycin (ZITHROMAX Z-PAK) 250 MG tablet Take 1 tablet (250 mg total) by mouth daily. Take as directed for 6 days (Patient not taking: Reported on 01/07/2018)    No facility-administered encounter medications on file as of 01/07/2018.       Medical History: Past Medical History:  Diagnosis Date  . Anemia    WITH PREGANCY ONLY  . Cancer (HCC)    BASAL CELL-BACK  . Complication of anesthesia    HARD TO WAKE UP AFTER HYSTERECTOMY  . Headache    MIGRAINES  . MS (multiple sclerosis) ([place])    PT STATES MRI SHOWED WHITE MATTER CHANGES AND LESION AND NEUROLGIST IS TREATING HER FOR MS-PT SEES DR Jeanette Lang, NEUROLOGIST AT [facility]  . Numbness and tingling    TO LEFT SIDE DAILY-SEES NEUROLOGIST  . PONV (postoperative nausea and vomiting)    NAUSEA ONLY  . TIA (transient ischemic attack) 06/2016  . Vision abnormalities   . White matter abnormality on MRI of brain    AND LESION PER PT-PT STATES HER NEUROLOGIST IS TREATING HER FOR MS-SEES DRDOUGLAS Lang AT [facility]     Vital Signs: BP 127/90 (BP Location: Right Arm, Patient Position: Sitting, Cuff Size: Normal)   Pulse 97   Temp 97.8 F (36.6 C)   Resp 16   Ht 4'  11" (1.499 m)   Wt 103 lb (46.7 kg)   SpO2 98%   BMI 20.80 kg/m    Review of Systems  Constitutional: Positive for appetite change, chills and fatigue. Negative for activity change and fever.  HENT: Positive for congestion, ear pain, postnasal drip, rhinorrhea, sinus pressure, sinus pain, sneezing, sore throat and voice change.   Eyes: Negative.   Respiratory: Positive for cough. Negative for wheezing.   Cardiovascular: Negative for chest pain and palpitations.  Gastrointestinal: Positive for nausea.  Endocrine: Negative for cold intolerance, heat intolerance, polydipsia, polyphagia and polyuria.  Genitourinary: Negative.   Musculoskeletal: Positive for arthralgias and back pain.  Skin: Negative for rash.  Allergic/Immunologic:  Positive for environmental allergies.  Neurological: Positive for dizziness and headaches.  Hematological: Positive for adenopathy.  Psychiatric/Behavioral: Positive for dysphoric mood.    Physical Exam  Constitutional: She is oriented to person, place, and time. She appears well-developed and well-nourished. She appears ill. No distress.  HENT:  Head: Normocephalic and atraumatic.  Right Ear: Tympanic membrane is bulging.  Left Ear: Tympanic membrane is bulging.  Nose: Rhinorrhea and sinus tenderness present. Right sinus exhibits maxillary sinus tenderness and frontal sinus tenderness. Left sinus exhibits maxillary sinus tenderness and frontal sinus tenderness.  Mouth/Throat: Posterior oropharyngeal erythema present. No oropharyngeal exudate.  Eyes: Pupils are equal, round, and reactive to light. EOM are normal.  Neck: Normal range of motion. Neck supple. No JVD present. No tracheal deviation present. No thyromegaly present.  Cardiovascular: Normal rate, regular rhythm and normal heart sounds. Exam reveals no gallop and no friction rub.  No murmur heard. Pulmonary/Chest: Effort normal and breath sounds normal. No respiratory distress. She has no wheezes. She has no rales. She exhibits no tenderness.  Abdominal: Soft. Bowel sounds are normal. There is no tenderness.  Musculoskeletal: Normal range of motion.  Lymphadenopathy:    She has cervical adenopathy.  Neurological: She is alert and oriented to person, place, and time. No cranial nerve deficit.  Skin: Skin is warm and dry. She is not diaphoretic.  Psychiatric: She has a normal mood and affect. Her behavior is normal. Judgment and thought content normal.  Nursing note and vitals reviewed.  Assessment/Plan: 1. Acute upper respiratory infection - azithromycin (ZITHROMAX Z-PAK) 250 MG tablet; Take 1 tablet (250 mg total) by mouth daily. Take as directed for 6 days  Dispense: 6 each; Refill: 0  2. White matter abnormality on MRI of  brain Discussed recent visits with neurology. Printed out labs so patient could take previous lab results with her. Reassess at next visit.   General Counseling: Jeanette Lang understanding of the findings of todays visit and agrees with plan of treatment. I have discussed any further diagnostic evaluation that may be needed or ordered today. We also reviewed her medications today. she has been encouraged to call the office with any questions or concerns that should arise related to todays visit.  Rest and increase fluids. Continue using OTC medication to control symptoms.   This patient was seen by Leretha Pol, FNP- C in Collaboration with Dr Lavera Guise as a part of collaborative care agreement   Meds ordered this encounter  Medications  . azithromycin (ZITHROMAX Z-PAK) 250 MG tablet    Sig: Take 1 tablet (250 mg total) by mouth daily. Take as directed for 6 days    Dispense:  6 each    Refill:  0    Order Specific Question:   Supervising Provider    Answer:  Clayborn Bigness M [[id number]]    Time spent: 15 Minutes

## 2018-01-09 LAB — CBC
Hematocrit: 42.6 % (ref 34.0–46.6)
Hemoglobin: 13.8 g/dL (ref 11.1–15.9)
MCH: 28.9 pg (ref 26.6–33.0)
MCHC: 32.4 g/dL (ref 31.5–35.7)
MCV: 89 fL (ref 79–97)
Platelets: 210 10*3/uL (ref 150–379)
RBC: 4.77 x10E6/uL (ref 3.77–5.28)
RDW: 12.7 % (ref 12.3–15.4)
WBC: 4.9 10*3/uL (ref 3.4–10.8)

## 2018-01-09 LAB — PHOSPHORUS: PHOSPHORUS: 4.3 mg/dL (ref 2.5–4.5)

## 2018-01-09 LAB — COMPREHENSIVE METABOLIC PANEL
ALT: 28 IU/L (ref 0–32)
AST: 18 IU/L (ref 0–40)
Albumin/Globulin Ratio: 1.8 (ref 1.2–2.2)
Albumin: 4.4 g/dL (ref 3.5–5.5)
Alkaline Phosphatase: 86 IU/L (ref 39–117)
BUN/Creatinine Ratio: 11 (ref 9–23)
BUN: 10 mg/dL (ref 6–24)
Bilirubin Total: 0.2 mg/dL (ref 0.0–1.2)
CO2: 25 mmol/L (ref 20–29)
Calcium: 9.5 mg/dL (ref 8.7–10.2)
Chloride: 101 mmol/L (ref 96–106)
Creatinine, Ser: 0.93 mg/dL (ref 0.57–1.00)
GFR calc Af Amer: 85 mL/min/{1.73_m2} (ref >59)
GFR calc non Af Amer: 73 mL/min/{1.73_m2} (ref >59)
Globulin, Total: 2.5 g/dL (ref 1.5–4.5)
Glucose: 83 mg/dL (ref 65–99)
Potassium: 4.3 mmol/L (ref 3.5–5.2)
Sodium: 142 mmol/L (ref 134–144)
Total Protein: 6.9 g/dL (ref 6.0–8.5)

## 2018-01-09 LAB — LIPID PANEL W/O CHOL/HDL RATIO
Cholesterol, Total: 229 mg/dL — ABNORMAL HIGH (ref 100–199)
HDL: 52 mg/dL (ref >39)
LDL Calculated: 154 mg/dL — ABNORMAL HIGH (ref 0–99)
Triglycerides: 115 mg/dL (ref 0–149)
VLDL Cholesterol Cal: 23 mg/dL (ref 5–40)

## 2018-01-09 LAB — MAGNESIUM: Magnesium: 2 mg/dL (ref 1.6–2.3)

## 2018-01-09 LAB — EBV, CHRONIC/ACTIVE INFECTION
EBV Early Antigen Ab, IgG: 37.3 U/mL — ABNORMAL HIGH (ref 0.0–8.9)
EBV NA IgG: 101 U/mL — ABNORMAL HIGH (ref 0.0–17.9)
EBV VCA IgG: 308 U/mL — ABNORMAL HIGH (ref 0.0–17.9)

## 2018-01-09 LAB — B12 AND FOLATE PANEL
Folate: >20 ng/mL (ref >3.0)
Vitamin B-12: 709 pg/mL (ref 232–1245)

## 2018-01-09 LAB — ANA W/REFLEX IF POSITIVE
Anti Nuclear Antibody(ANA): POSITIVE — AB
Centromere Ab Screen: <0.2 AI (ref 0.0–0.9)
Chromatin Ab SerPl-aCnc: <0.2 AI (ref 0.0–0.9)
DSDNA AB: 1 [IU]/mL (ref 0–9)
ENA SM Ab Ser-aCnc: <0.2 AI (ref 0.0–0.9)
ENA SSA (RO) AB: 7.4 AI — AB (ref 0.0–0.9)
ENA SSB (LA) Ab: 0.2 AI (ref 0.0–0.9)
Scleroderma SCL-70: <0.2 AI (ref 0.0–0.9)

## 2018-01-09 LAB — IRON AND TIBC
Iron Saturation: 23 % (ref 15–55)
Iron: 72 ug/dL (ref 27–159)
Total Iron Binding Capacity: 314 ug/dL (ref 250–450)
UIBC: 242 ug/dL (ref 131–425)

## 2018-01-09 LAB — SEDIMENTATION RATE: Sed Rate: 5 mm/hr (ref 0–32)

## 2018-01-09 LAB — FERRITIN: FERRITIN: 49 ng/mL (ref 15–150)

## 2018-01-09 LAB — TSH: TSH: 2.78 u[IU]/mL (ref 0.450–4.500)

## 2018-01-09 LAB — CK: Total CK: 64 U/L (ref 24–173)

## 2018-01-09 LAB — RHEUMATOID FACTOR: Rheumatoid fact SerPl-aCnc: <10 IU/mL (ref 0.0–13.9)

## 2018-01-09 LAB — T4, FREE: FREE T4: 1.08 ng/dL (ref 0.82–1.77)

## 2018-01-09 LAB — VITAMIN D 25 HYDROXY (VIT D DEFICIENCY, FRACTURES): Vit D, 25-Hydroxy: 54.4 ng/mL (ref 30.0–100.0)

## 2018-01-12 ENCOUNTER — Telehealth: Payer: Self-pay

## 2018-01-12 NOTE — Telephone Encounter (Signed)
Pt advised for labs showing epstein  Barr virus active  And ANA positive with SSA or mixed connective  Tissue  And also copy of labs for pick up at front

## 2018-01-15 ENCOUNTER — Ambulatory Visit: Payer: Self-pay | Admitting: Nurse Practitioner

## 2018-01-27 DIAGNOSIS — J069 Acute upper respiratory infection, unspecified: Secondary | ICD-10-CM | POA: Insufficient documentation

## 2018-01-27 DIAGNOSIS — R9082 White matter disease, unspecified: Secondary | ICD-10-CM | POA: Insufficient documentation

## 2018-03-15 ENCOUNTER — Ambulatory Visit: Payer: Self-pay | Admitting: Nurse Practitioner

## 2018-03-31 ENCOUNTER — Other Ambulatory Visit: Payer: Self-pay | Admitting: General Surgery

## 2018-03-31 DIAGNOSIS — N63 Unspecified lump in unspecified breast: Secondary | ICD-10-CM

## 2018-04-23 ENCOUNTER — Ambulatory Visit: Payer: BC Managed Care – PPO

## 2018-04-23 ENCOUNTER — Other Ambulatory Visit: Payer: BC Managed Care – PPO

## 2018-05-05 ENCOUNTER — Telehealth: Payer: Self-pay

## 2018-05-05 ENCOUNTER — Other Ambulatory Visit: Payer: Self-pay | Admitting: Nurse Practitioner

## 2018-05-05 DIAGNOSIS — N390 Urinary tract infection, site not specified: Secondary | ICD-10-CM

## 2018-05-05 MED ORDER — AMOXICILLIN-POT CLAVULANATE 875-125 MG PO TABS
1.0000 | ORAL_TABLET | Freq: Two times a day (BID) | ORAL | 0 refills | Status: DC
Start: 2018-05-05 — End: 2019-01-19

## 2018-05-05 NOTE — Telephone Encounter (Signed)
PT WAS NOTIFIED. 

## 2018-05-05 NOTE — Progress Notes (Signed)
For uti, sent in rx for augmenti 875mg  bid for 10 days  Drink plenty of water. Take OTC AZO for bladder pain/spasms.

## 2018-05-05 NOTE — Telephone Encounter (Signed)
For uti, sent in rx for augmenti 875mg  bid for 10 days  Drink plenty of water. Take OTC AZO for bladder pain/spasms.

## 2018-07-14 ENCOUNTER — Other Ambulatory Visit: Payer: Self-pay | Admitting: Psychiatry

## 2018-07-14 DIAGNOSIS — G35 Multiple sclerosis: Secondary | ICD-10-CM

## 2018-07-28 ENCOUNTER — Ambulatory Visit: Payer: BC Managed Care – PPO

## 2018-07-28 ENCOUNTER — Other Ambulatory Visit: Payer: BC Managed Care – PPO

## 2018-09-03 ENCOUNTER — Ambulatory Visit: Payer: BC Managed Care – PPO | Admitting: Nurse Practitioner

## 2018-09-03 ENCOUNTER — Encounter: Payer: Self-pay | Admitting: Nurse Practitioner

## 2018-09-03 VITALS — BP 126/79 | HR 94 | Resp 16 | Ht 59.0 in | Wt 104.0 lb

## 2018-09-03 DIAGNOSIS — R42 Dizziness and giddiness: Secondary | ICD-10-CM | POA: Insufficient documentation

## 2018-09-03 DIAGNOSIS — R29898 Other symptoms and signs involving the musculoskeletal system: Secondary | ICD-10-CM

## 2018-09-03 DIAGNOSIS — R9082 White matter disease, unspecified: Secondary | ICD-10-CM | POA: Diagnosis not present

## 2018-09-03 DIAGNOSIS — G35 Multiple sclerosis: Secondary | ICD-10-CM

## 2018-09-03 NOTE — Progress Notes (Signed)
[facility] [place], [place] [address]  Internal MEDICINE  Office Visit Note  Patient Name: Jeanette Lang  [id number]  [id number]  Date of Service: 09/03/2018  Chief Complaint  Patient presents with  . Headache  . Anemia  . Follow-up    need referral    The patient would like to have referral back to [facility] Neurology. She has been seeing a neurologist in [place]. In May, was diagnosed with MS. Started monthly treatments. Had treatments in July, August, and September. Her last follow up with her neurologist was in September after a follow up MRI. Had some new areas of white matter changes in the back of the brain. Neurologist want to change her treatment. Has already had first two treatments. Talked to [place] advocate and infusion nurse at her last visit on October 22. The general feeling is that her treatment started treatments too aggressively, or the MS is more aggressive than initially thought. It has been suggested that she have a second opinion with neurosciences department at [facility]. This would also be helpful to her. This is closer to home and much more convenient.       Current Medication: Outpatient Encounter Medications as of 09/03/2018  Medication Sig Note  . amoxicillin-clavulanate (AUGMENTIN) 875-125 MG tablet Take 1 tablet by mouth 2 (two) times daily.   Marland Kitchen amphetamine-dextroamphetamine (ADDERALL) 20 MG tablet Take 20 mg by mouth 2 (two) times daily.    Marland Kitchen aspirin EC 81 MG tablet Take 1 tablet (81 mg total) by mouth daily. (Patient not taking: Reported on 01/07/2018) 10/16/2017: ON HOLD  . azithromycin (ZITHROMAX Z-PAK) 250 MG tablet Take 1 tablet (250 mg total) by mouth daily. Take as directed for 6 days   . Calcium Carbonate-Vitamin D (CALTRATE 600+D PO) Take 1 tablet by mouth daily.   Marland Kitchen estradiol (ESTRACE) 2 MG tablet Take 2 mg by mouth daily.   . Multiple Vitamin (MULTIVITAMIN) tablet Take 1 tablet by mouth daily.   Marland Kitchen omeprazole (PRILOSEC) 40 MG  capsule    . oseltamivir (TAMIFLU) 75 MG capsule Take 1 capsule (75 mg total) by mouth 2 (two) times daily. For 5 days (Patient not taking: Reported on 01/07/2018)   . pregabalin (LYRICA) 50 MG capsule Take 50 mg by mouth 2 (two) times daily. SUPPOSED TO TAKE BID BUT FORGETS 2ND DOSE SOMETIMES-TAKES AM DOSE DAILY   . sucralfate (CARAFATE) 1 g tablet Take 1 tablet (1 g total) by mouth 4 (four) times daily for 15 days.   . SUMAtriptan (IMITREX) 50 MG tablet Take 50 mg by mouth every 2 (two) hours as needed for migraine. May repeat in 2 hours if headache persists or recurs.   . Topiramate ER (TROKENDI XR) 100 MG CP24 Take 1 capsule by mouth daily. 10/16/2017: ON HOLD   No facility-administered encounter medications on file as of 09/03/2018.     Surgical History: Past Surgical History:  Procedure Laterality Date  . ABDOMINAL HYSTERECTOMY  2012   complete  . APPENDECTOMY    . EXCISION OF BREAST LESION Right 10/22/2017   Procedure: EXCISION OF BREAST MASS;  Surgeon: Herbert Pun, MD;  Location: [facility] ORS;  Service: General;  Laterality: Right;    Medical History: Past Medical History:  Diagnosis Date  . Anemia    WITH PREGANCY ONLY  . Cancer (HCC)    BASAL CELL-BACK  . Complication of anesthesia    HARD TO WAKE UP AFTER HYSTERECTOMY  . Headache    MIGRAINES  .  MS (multiple sclerosis) ([place])    PT STATES MRI SHOWED WHITE MATTER CHANGES AND LESION AND NEUROLGIST IS TREATING HER FOR MS-PT SEES DR DOUGLAS JEFFRIES, NEUROLOGIST AT [facility]  . Numbness and tingling    TO LEFT SIDE DAILY-SEES NEUROLOGIST  . PONV (postoperative nausea and vomiting)    NAUSEA ONLY  . TIA (transient ischemic attack) 06/2016  . Vision abnormalities   . White matter abnormality on MRI of brain    AND LESION PER PT-PT STATES HER NEUROLOGIST IS TREATING HER FOR MS-SEES DRDOUGLAS JEFFREY AT [place]    Family History: Family History  Problem Relation Age of Onset  . CVA Neg Hx     Social  History   Socioeconomic History  . Marital status: Married    Spouse name: Not on file  . Number of children: Not on file  . Years of education: Not on file  . Highest education level: Not on file  Occupational History  . Not on file  Social Needs  . Financial resource strain: Not on file  . Food insecurity:    Worry: Not on file    Inability: Not on file  . Transportation needs:    Medical: Not on file    Non-medical: Not on file  Tobacco Use  . Smoking status: Never Smoker  . Smokeless tobacco: Never Used  Substance and Sexual Activity  . Alcohol use: Yes    Comment: OCC SIP OF WINE RARE  . Drug use: No  . Sexual activity: Not on file  Lifestyle  . Physical activity:    Days per week: Not on file    Minutes per session: Not on file  . Stress: Not on file  Relationships  . Social connections:    Talks on phone: Not on file    Gets together: Not on file    Attends religious service: Not on file    Active member of club or organization: Not on file    Attends meetings of clubs or organizations: Not on file    Relationship status: Not on file  . Intimate partner violence:    Fear of current or ex partner: Not on file    Emotionally abused: Not on file    Physically abused: Not on file    Forced sexual activity: Not on file  Other Topics Concern  . Not on file  Social History Narrative  . Not on file      Review of Systems  Constitutional: Positive for appetite change and fatigue. Negative for activity change, chills and fever.  HENT: Negative for congestion, ear pain, postnasal drip, rhinorrhea, sinus pressure, sinus pain, sneezing, sore throat and voice change.   Eyes: Negative.   Respiratory: Negative for cough and wheezing.   Cardiovascular: Negative for chest pain and palpitations.  Gastrointestinal: Positive for nausea.       Currently being treated per GI for gastric and peptic ulcer disease   Endocrine: Negative for cold intolerance, heat intolerance,  polydipsia, polyphagia and polyuria.  Genitourinary: Negative.   Musculoskeletal: Positive for arthralgias, back pain and myalgias.       States that legs will give out at any reason without reason  Skin: Negative for rash.  Allergic/Immunologic: Positive for environmental allergies.  Neurological: Positive for dizziness, weakness and headaches.       Diagnosed with MS in May, 2019. Is on second type of treatment. Feeling as though her disease process may be more aggressive than initially thought. Numbness and  tingling on both legs and legs can give out at any time without any good reason.   Hematological: Negative for adenopathy.  Psychiatric/Behavioral: Positive for dysphoric mood.    Today's Vitals   09/03/18 0829  BP: 126/79  Pulse: 94  Resp: 16  SpO2: 98%  Weight: 104 lb (47.2 kg)  Height: 4\' 11"  (1.499 m)    Physical Exam  Constitutional: She is oriented to person, place, and time. She appears well-developed and well-nourished. No distress.  HENT:  Head: Normocephalic and atraumatic.  Mouth/Throat: Oropharynx is clear and moist. No oropharyngeal exudate.  Eyes: Pupils are equal, round, and reactive to light. EOM are normal.  Neck: Normal range of motion. Neck supple. No JVD present. No tracheal deviation present. No thyromegaly present.  Cardiovascular: Normal rate, regular rhythm and normal heart sounds. Exam reveals no gallop and no friction rub.  No murmur heard. Pulmonary/Chest: Effort normal and breath sounds normal. No respiratory distress. She has no wheezes. She has no rales. She exhibits no tenderness.  Abdominal: Soft. Bowel sounds are normal.  Musculoskeletal: Normal range of motion.  Lymphadenopathy:    She has no cervical adenopathy.  Neurological: She is alert and oriented to person, place, and time. A sensory deficit is present. No cranial nerve deficit. Coordination abnormal.  Skin: Skin is warm and dry. Capillary refill takes less than 2 seconds. She is not  diaphoretic.  Psychiatric: Her speech is normal and behavior is normal. Judgment and thought content normal. Her mood appears anxious. Cognition and memory are normal.  Nursing note and vitals reviewed.  Assessment/Plan: 1. White matter abnormality on MRI of brain Diagnosed with MS in May, 2019. Disease progression more aggressive than initially thought. Would like second opinion and further treatment done per [facility] neurosciences. Referral to be made today.  - Ambulatory referral to Neurology  2. Multiple sclerosis ([place]) Diagnosed with MS in May, 2019. Disease progression more aggressive than initially thought. Would like second opinion and further treatment done per [facility] neurosciences. Referral to be made today.  - Ambulatory referral to Neurology  3. Weakness of both hips Getting more severe. Referral made to [facility] neurology for further evaluation and treatment.   4. Dizziness Felt to be aspect of MS. Will refer to [facility] Neurology for further evaluation and treatment.   General Counseling: mackayla mullins understanding of the findings of todays visit and agrees with plan of treatment. I have discussed any further diagnostic evaluation that may be needed or ordered today. We also reviewed her medications today. she has been encouraged to call the office with any questions or concerns that should arise related to todays visit.  This patient was seen by Leretha Pol FNP Collaboration with Dr Lavera Guise as a part of collaborative care agreement  Orders Placed This Encounter  Procedures  . Ambulatory referral to Neurology      Time spent: [place] [facility] medicine

## 2018-09-13 ENCOUNTER — Other Ambulatory Visit: Payer: Self-pay | Admitting: General Surgery

## 2018-09-13 DIAGNOSIS — Z1231 Encounter for screening mammogram for malignant neoplasm of breast: Secondary | ICD-10-CM

## 2018-10-28 ENCOUNTER — Ambulatory Visit
Admission: RE | Admit: 2018-10-28 | Discharge: 2018-10-28 | Disposition: A | Discharge status: Home / Self Care | Payer: BC Managed Care – PPO | Source: Ambulatory Visit | Attending: General Surgery | Admitting: General Surgery

## 2018-10-28 DIAGNOSIS — Z1231 Encounter for screening mammogram for malignant neoplasm of breast: Secondary | ICD-10-CM | POA: Diagnosis present

## 2019-01-19 ENCOUNTER — Telehealth: Payer: BC Managed Care – PPO | Admitting: Nurse Practitioner

## 2019-01-19 DIAGNOSIS — J309 Allergic rhinitis, unspecified: Secondary | ICD-10-CM

## 2019-01-19 DIAGNOSIS — J069 Acute upper respiratory infection, unspecified: Secondary | ICD-10-CM

## 2019-01-19 MED ORDER — AZITHROMYCIN 250 MG PO TABS: 250.0000 mg | ORAL_TABLET | Freq: Every day | ORAL | 0 refills | Status: DC

## 2019-01-19 MED ORDER — FLUTICASONE PROPIONATE 50 MCG/ACT NA SUSP: 2.0000 | Freq: Every day | NASAL | 3 refills | Status: AC

## 2019-01-19 NOTE — Telephone Encounter (Signed)
Patient having signs and symptoms of nasal congestion, puffy and watery eyes, and sore throat at night. No fever or body aches. Unable to come in due to compromised immune system. Will start z-pack. Take as directed for 5 days. Also added flonase nasal spray. Use two sprays in both nostrils daily. Recommend she take otC claritin every day to help alleviate allergy symptoms. Should also use OTC eye drops to alleviate itchy and watery eyes. Prescriptions sent to [facility] [place]. If no better over next few days, she should contact the office again.

## 2019-01-19 NOTE — Telephone Encounter (Signed)
-----   Message from [facility] sent at 01/19/2019  3:32 PM EDT ----- Congestion, dry cough. Sore throat mostly at night, raspy voice, eyes are very red and puffy.no energy Please call something into [place] [facility] bc of her very bad immune system does not want to come in. Beth

## 2019-01-20 ENCOUNTER — Encounter: Payer: Self-pay | Admitting: Nurse Practitioner

## 2019-01-20 ENCOUNTER — Ambulatory Visit: Payer: BC Managed Care – PPO | Admitting: Nurse Practitioner

## 2019-01-20 ENCOUNTER — Ambulatory Visit
Admission: RE | Admit: 2019-01-20 | Discharge: 2019-01-20 | Disposition: A | Discharge status: Home / Self Care | Payer: BC Managed Care – PPO | Source: Ambulatory Visit | Attending: Nurse Practitioner | Admitting: Nurse Practitioner

## 2019-01-20 ENCOUNTER — Telehealth: Payer: Self-pay

## 2019-01-20 ENCOUNTER — Ambulatory Visit
Admission: RE | Admit: 2019-01-20 | Discharge: 2019-01-20 | Disposition: A | Discharge status: Home / Self Care | Payer: BC Managed Care – PPO | Attending: Nurse Practitioner | Admitting: Nurse Practitioner

## 2019-01-20 ENCOUNTER — Other Ambulatory Visit: Payer: Self-pay

## 2019-01-20 VITALS — BP 134/84 | HR 114 | Temp 98.1°F | Resp 16 | Ht 59.0 in | Wt 108.4 lb

## 2019-01-20 DIAGNOSIS — R05 Cough: Secondary | ICD-10-CM | POA: Diagnosis not present

## 2019-01-20 DIAGNOSIS — R0602 Shortness of breath: Secondary | ICD-10-CM | POA: Diagnosis present

## 2019-01-20 DIAGNOSIS — J069 Acute upper respiratory infection, unspecified: Secondary | ICD-10-CM | POA: Diagnosis not present

## 2019-01-20 DIAGNOSIS — R059 Cough, unspecified: Secondary | ICD-10-CM | POA: Insufficient documentation

## 2019-01-20 MED ORDER — PREDNISONE 10 MG (21) PO TBPK: ORAL_TABLET | ORAL | 0 refills | Status: DC

## 2019-01-20 NOTE — Telephone Encounter (Signed)
Subjective: Pt has history of MS C/O sore throat, fever, aches and cough( she did visit the cancer center with her dad) Z-pak was called in yesterday, Started having cramping last night and has some loose stools. Started pain from the chest to the hips Pt did not take her temp Observation: pt coughed over the phone which was more raspy then congested, Pt is having SOB.

## 2019-01-20 NOTE — Progress Notes (Signed)
[facility] [place], [place] [address]  Internal MEDICINE  Office Visit Note  Patient Name: Jeanette Lang  [id number]  [id number]  Date of Service: 01/20/2019   Pt is here for a sick visit.  Chief Complaint  Patient presents with  . Cough    pt started zpack yesterday,   . Eye Problem    red eyes, has been getting really hot and burning,    . Medical Management of Chronic Issues    pt has been having some sweats, thought it was hormonal,  . Sore Throat    about 5 days ago, pt has a weak immune system  . Fatigue    pt has not been feeling like doing anything, body feels hot and cold day and night, feels like heat coming from her eyes,   . Pain    when breathing in and out pt chest feels like its bruised, sort of a burning sensation, comes and goes,body pain, abdominal pain last night, looser bowels the second time, was almost near liquidfied, pt has not had a bowel movement today.   . Shortness of Breath    going up and down stairs felt like she ran a marathon,      The patient states that she started with sore throat about 5 days ago. Gradually developed into chest tightness and shortness of breath. Patient is unable to do much activity without getting severely short of breath. She is severely fatigued and nauseated. She did take initial dose of zithromax last evening. hada few episodes of diarrhea during the night. Had severe lower abdominal cramps. Nausea has improved some since last night. States that she is really hurting all over. Pain is severe on right side of her body. Pain is nearly bringing her to tears due to pain.        Current Medication:  Outpatient Encounter Medications as of 01/20/2019  Medication Sig Note  . amphetamine-dextroamphetamine (ADDERALL) 20 MG tablet Take 20 mg by mouth 2 (two) times daily.    Marland Kitchen azithromycin (ZITHROMAX Z-PAK) 250 MG tablet Take 1 tablet (250 mg total) by mouth daily. Take as directed for 6 days   .  Calcium Carbonate-Vitamin D (CALTRATE 600+D PO) Take 1 tablet by mouth daily.   Marland Kitchen estradiol (ESTRACE) 2 MG tablet Take 2 mg by mouth daily.   . fluticasone (FLONASE) 50 MCG/ACT nasal spray Place 2 sprays into both nostrils daily.   . Multiple Vitamin (MULTIVITAMIN) tablet Take 1 tablet by mouth daily.   Marland Kitchen omeprazole (PRILOSEC) 40 MG capsule    . pregabalin (LYRICA) 50 MG capsule Take 50 mg by mouth 2 (two) times daily. SUPPOSED TO TAKE BID BUT FORGETS 2ND DOSE SOMETIMES-TAKES AM DOSE DAILY   . SUMAtriptan (IMITREX) 50 MG tablet Take 50 mg by mouth every 2 (two) hours as needed for migraine. May repeat in 2 hours if headache persists or recurs.   . Topiramate ER (TROKENDI XR) 100 MG CP24 Take 1 capsule by mouth daily. 10/16/2017: ON HOLD  . aspirin EC 81 MG tablet Take 1 tablet (81 mg total) by mouth daily. (Patient not taking: Reported on 01/07/2018) 10/16/2017: ON HOLD  . predniSONE (STERAPRED UNI-PAK 21 TAB) 10 MG (21) TBPK tablet 6 day taper - take by mouth as directed for 6 days   . sucralfate (CARAFATE) 1 g tablet Take 1 tablet (1 g total) by mouth 4 (four) times daily for 15 days.    No facility-administered encounter medications  on file as of 01/20/2019.       Medical History: Past Medical History:  Diagnosis Date  . Anemia    WITH PREGANCY ONLY  . Cancer (HCC)    BASAL CELL-BACK  . Complication of anesthesia    HARD TO WAKE UP AFTER HYSTERECTOMY  . Headache    MIGRAINES  . MS (multiple sclerosis) ([place])    PT STATES MRI SHOWED WHITE MATTER CHANGES AND LESION AND NEUROLGIST IS TREATING HER FOR MS-PT SEES DR DOUGLAS JEFFRIES, NEUROLOGIST AT [facility] [place]  . Numbness and tingling    TO LEFT SIDE DAILY-SEES NEUROLOGIST  . PONV (postoperative nausea and vomiting)    NAUSEA ONLY  . TIA (transient ischemic attack) 06/2016  . Vision abnormalities   . White matter abnormality on MRI of brain    AND LESION PER PT-PT STATES HER NEUROLOGIST IS TREATING HER FOR MS-SEES DRDOUGLAS JEFFREY AT  [facility]     Vital Signs: BP 134/84 (BP Location: Left Arm, Patient Position: Sitting, Cuff Size: Normal) Comment: 1st reading 142/92  Pulse (!) 114   Temp 98.1 F (36.7 C)   Resp 16   Ht 4\' 11"  (1.499 m)   Wt 108 lb 6.4 oz (49.2 kg)   SpO2 98%   BMI 21.89 kg/m    Review of Systems  Constitutional: Positive for fatigue. Negative for fever.  HENT: Positive for congestion, postnasal drip, rhinorrhea and sore throat.   Respiratory: Positive for cough and chest tightness. Negative for wheezing.   Cardiovascular: Positive for chest pain. Negative for palpitations.  Gastrointestinal: Positive for diarrhea and nausea. Negative for vomiting.  Musculoskeletal: Positive for arthralgias, back pain and myalgias.  Allergic/Immunologic: Positive for environmental allergies.  Neurological: Positive for headaches.  Hematological: Positive for adenopathy.  Psychiatric/Behavioral: The patient is nervous/anxious.     Physical Exam Constitutional:      General: She is not in acute distress.    Appearance: She is well-developed. She is ill-appearing. She is not diaphoretic.  HENT:     Head: Normocephalic and atraumatic.     Mouth/Throat:     Pharynx: No oropharyngeal exudate.  Eyes:     Conjunctiva/sclera: Conjunctivae normal.     Pupils: Pupils are equal, round, and reactive to light.  Neck:     Musculoskeletal: Normal range of motion and neck supple.     Thyroid: No thyromegaly.     Vascular: No JVD.     Trachea: No tracheal deviation.  Cardiovascular:     Rate and Rhythm: Regular rhythm. Tachycardia present.     Heart sounds: Normal heart sounds. No murmur. No friction rub. No gallop.      Comments: Chest discomfort present when taking deep breaths. Holding and guarding chest when deep breathing. Pulmonary:     Effort: Pulmonary effort is normal. No respiratory distress.     Breath sounds: Normal breath sounds. No wheezing or rales.  Chest:     Chest wall: No tenderness.   Abdominal:     General: Bowel sounds are normal.     Palpations: Abdomen is soft.  Musculoskeletal: Normal range of motion.  Lymphadenopathy:     Cervical: No cervical adenopathy.  Skin:    General: Skin is warm and dry.  Neurological:     Mental Status: She is alert and oriented to person, place, and time.     Cranial Nerves: No cranial nerve deficit.  Psychiatric:        Behavior: Behavior normal.  Thought Content: Thought content normal.        Judgment: Judgment normal.   Assessment/Plan:  1. Acute upper respiratory infection Recommend she continue z-pack until complete. Add prednisone 10mg  dose pack. Take as directed for 6 days. Will get chest x-ray for further evaluation. Patient immunocompromised and may have contracted COVID 67. Work note given to her and her husband, recommending they self-quarantine for next 10 days. Patient voiced understanding and agreement with this plan.  - predniSONE (STERAPRED UNI-PAK 21 TAB) 10 MG (21) TBPK tablet; 6 day taper - take by mouth as directed for 6 days  Dispense: 21 tablet; Refill: 0  2. Shortness of breath Add prednisone 10mg  dose pack. Take as directed for 6 days. Will get chest x-ray for further evaluation.  - DG Chest 2 View; Future  3. Cough Spirometry showing decreased respiratory effort and expiratory volumes. Chest x-ray ordered for further evaluation.  - Spirometry with Graph  General Counseling: Elise Benne understanding of the findings of todays visit and agrees with plan of treatment. I have discussed any further diagnostic evaluation that may be needed or ordered today. We also reviewed her medications today. she has been encouraged to call the office with any questions or concerns that should arise related to todays visit.    Counseling:  self-quarantine for 7 days and an additional 3 days after respiratory symptoms resolve. Everyone in his home should be vigilant about social distancing, and may benefit from  self-quarantining for next 10 days. Take tylenol as needed for fever and headache. Contact PCP or ER if symptoms get worse or do not get better over next several days.   Rest and increase fluids. Continue using OTC medication to control symptoms.   This patient was seen by Leretha Pol FNP Collaboration with Dr Lavera Guise as a part of collaborative care agreement  Orders Placed This Encounter  Procedures  . DG Chest 2 View  . Spirometry with Graph    Meds ordered this encounter  Medications  . predniSONE (STERAPRED UNI-PAK 21 TAB) 10 MG (21) TBPK tablet    Sig: 6 day taper - take by mouth as directed for 6 days    Dispense:  21 tablet    Refill:  0    Order Specific Question:   Supervising Provider    Answer:   Lavera Guise [[id number]]    Time spent: 30 Minutes

## 2019-01-20 NOTE — Progress Notes (Signed)
Pt blood pressure elevated Taken twice, 1st reading 142/92 2nd reading 134/84 Pt pulse elevated Informed provider

## 2019-01-25 ENCOUNTER — Other Ambulatory Visit: Payer: Self-pay | Admitting: Nurse Practitioner

## 2019-01-25 ENCOUNTER — Telehealth: Payer: Self-pay | Admitting: Nurse Practitioner

## 2019-01-25 DIAGNOSIS — R0602 Shortness of breath: Secondary | ICD-10-CM

## 2019-01-25 MED ORDER — ALBUTEROL SULFATE HFA 108 (90 BASE) MCG/ACT IN AERS: 2.0000 | INHALATION_SPRAY | Freq: Four times a day (QID) | RESPIRATORY_TRACT | 2 refills | Status: AC | PRN

## 2019-01-25 NOTE — Telephone Encounter (Signed)
Patient called and asked if you can prescribe a inhaler for her , she states that she sent a message via my chart but I do not see it

## 2019-01-25 NOTE — Progress Notes (Signed)
Sent prescription for albuterol rescue inhaler to [facility] in [place]. She should use two inhalers every 4 to 6 hours as needed for shortness of breath.

## 2019-01-25 NOTE — Telephone Encounter (Signed)
Sent prescription for albuterol rescue inhaler to [facility] in [place]. She should use two inhalers every 4 to 6 hours as needed for shortness of breath.

## 2019-04-04 ENCOUNTER — Ambulatory Visit
Admission: RE | Admit: 2019-04-04 | Discharge: 2019-04-04 | Disposition: A | Discharge status: Home / Self Care | Payer: BC Managed Care – PPO | Source: Ambulatory Visit | Attending: Student | Admitting: Student

## 2019-04-04 ENCOUNTER — Other Ambulatory Visit: Payer: Self-pay

## 2019-04-04 ENCOUNTER — Other Ambulatory Visit: Payer: Self-pay | Admitting: Student

## 2019-04-04 ENCOUNTER — Other Ambulatory Visit
Admission: RE | Admit: 2019-04-04 | Discharge: 2019-04-04 | Disposition: A | Discharge status: Home / Self Care | Payer: BC Managed Care – PPO | Source: Ambulatory Visit | Attending: Student | Admitting: Student

## 2019-04-04 DIAGNOSIS — R110 Nausea: Secondary | ICD-10-CM

## 2019-04-04 DIAGNOSIS — K625 Hemorrhage of anus and rectum: Secondary | ICD-10-CM | POA: Diagnosis present

## 2019-04-04 DIAGNOSIS — R1084 Generalized abdominal pain: Secondary | ICD-10-CM

## 2019-04-04 LAB — CBC WITH DIFFERENTIAL/PLATELET
Abs Immature Granulocytes: 0.01 10*3/uL (ref 0.00–0.07)
Basophils Absolute: 0 10*3/uL (ref 0.0–0.1)
Basophils Relative: 1 %
Eosinophils Absolute: 0 10*3/uL (ref 0.0–0.5)
Eosinophils Relative: 0 %
HCT: 42.2 % (ref 36.0–46.0)
Hemoglobin: 14.6 g/dL (ref 12.0–15.0)
Immature Granulocytes: 0 %
Lymphocytes Relative: 21 %
Lymphs Abs: 1 10*3/uL (ref 0.7–4.0)
MCH: 28.2 pg (ref 26.0–34.0)
MCHC: 34.6 g/dL (ref 30.0–36.0)
MCV: 81.6 fL (ref 80.0–100.0)
Monocytes Absolute: 0.5 10*3/uL (ref 0.1–1.0)
Monocytes Relative: 10 %
Neutro Abs: 3.3 10*3/uL (ref 1.7–7.7)
Neutrophils Relative %: 68 %
Platelets: 255 10*3/uL (ref 150–400)
RBC: 5.17 MIL/uL — ABNORMAL HIGH (ref 3.87–5.11)
RDW: 12.1 % (ref 11.5–15.5)
WBC: 4.9 10*3/uL (ref 4.0–10.5)
nRBC: 0 % (ref 0.0–0.2)

## 2019-04-04 LAB — HEPATIC FUNCTION PANEL
ALT: 25 U/L (ref 0–44)
AST: 20 U/L (ref 15–41)
Albumin: 4.1 g/dL (ref 3.5–5.0)
Alkaline Phosphatase: 67 U/L (ref 38–126)
Bilirubin, Direct: <0.1 mg/dL (ref 0.0–0.2)
Total Bilirubin: 0.5 mg/dL (ref 0.3–1.2)
Total Protein: 6.9 g/dL (ref 6.5–8.1)

## 2019-04-04 LAB — BASIC METABOLIC PANEL
Anion gap: 8 (ref 5–15)
BUN: 13 mg/dL (ref 6–20)
CO2: 28 mmol/L (ref 22–32)
Calcium: 8.8 mg/dL — ABNORMAL LOW (ref 8.9–10.3)
Chloride: 101 mmol/L (ref 98–111)
Creatinine, Ser: 0.96 mg/dL (ref 0.44–1.00)
GFR calc Af Amer: >60 mL/min (ref >60)
GFR calc non Af Amer: >60 mL/min (ref >60)
Glucose, Bld: 97 mg/dL (ref 70–99)
Potassium: 4.6 mmol/L (ref 3.5–5.1)
Sodium: 137 mmol/L (ref 135–145)

## 2019-04-04 LAB — LIPASE, BLOOD: Lipase: 35 U/L (ref 11–51)

## 2019-04-04 LAB — AMYLASE: Amylase: 56 U/L (ref 28–100)

## 2019-04-04 MED ORDER — IOHEXOL 300 MG/ML  SOLN
75.0000 mL | Freq: Once | INTRAMUSCULAR | Status: AC | PRN
  Administered 2019-04-04: 75 mL via INTRAVENOUS

## 2020-01-05 ENCOUNTER — Telehealth: Payer: Self-pay

## 2020-01-05 NOTE — Telephone Encounter (Signed)
LMOM FOR PATIENT TO CONFIRM AND SCREEN FOR 01-09-20 OV. 

## 2020-01-09 ENCOUNTER — Encounter: Payer: Self-pay | Admitting: Nurse Practitioner

## 2020-01-09 ENCOUNTER — Ambulatory Visit: Payer: BC Managed Care – PPO | Admitting: Nurse Practitioner

## 2020-01-09 ENCOUNTER — Telehealth: Payer: Self-pay | Admitting: Nurse Practitioner

## 2020-01-09 VITALS — Ht 59.0 in | Wt 105.0 lb

## 2020-01-09 DIAGNOSIS — I889 Nonspecific lymphadenitis, unspecified: Secondary | ICD-10-CM | POA: Diagnosis not present

## 2020-01-09 DIAGNOSIS — R5383 Other fatigue: Secondary | ICD-10-CM | POA: Diagnosis not present

## 2020-01-09 DIAGNOSIS — B2700 Gammaherpesviral mononucleosis without complication: Secondary | ICD-10-CM

## 2020-01-09 DIAGNOSIS — R1013 Epigastric pain: Secondary | ICD-10-CM | POA: Diagnosis not present

## 2020-01-09 DIAGNOSIS — G35 Multiple sclerosis: Secondary | ICD-10-CM

## 2020-01-09 NOTE — Progress Notes (Signed)
[facility] [place], [place] [address]  Internal MEDICINE  Telephone Visit  Patient Name: Jeanette Lang  [id number]  [id number]  Date of Service: 01/09/2020  I connected with the patient at 8:49am by webcam and verified the patients identity using two identifiers.   I discussed the limitations, risks, security and privacy concerns of performing an evaluation and management service by webcam and the availability of in person appointments. I also discussed with the patient that there may be a patient responsible charge related to the service.  The patient expressed understanding and agrees to proceed.    Chief Complaint  Patient presents with  . Telephone Assessment  . Telephone Screen  . Fatigue    getting up and sitting down bones hurt   . Night Sweats    happens every morning around the same time, waking up in the middle of the night   . Insomnia  . Back Pain    low back pain on the right side, hurts more when putting weight on left side, rolling over in the bed hurts  . Lymphadenopathy    The patient has been contacted via webcam for follow up visit due to concerns for spread of novel coronavirus. She presents for acute visit. She states that about 5 months ago, she started having severe hot flashes. States that these cause night sweates, waking her up in the middle of the night and cannot go back to sleep because she is soaking wet with sweat. Was hoping it would go away, but it has persisted. Also having pain in right lower back, right next to the spine. Also has been going on for about 5 to 6 months. [place], [place], new mattress. She is no back to sleeping on her regular mattress. States that her lower back hurts all the time. Hurts when she moves, when she lays on her right side, and tries to roll back over to supine position. This hurts to even touch the area. She states that lymph nodes have been swollen. This has been  present for about the last week. Is having increased pain in her bones/joints when she changes position, especially going from sitting to standing. States that once she gets moving, she does better, pain is better. Able to keep going. States that she spoke to her rheumatologist about this. That provider increased her plaquenil. She is now taking 400mg  every other day, alternating with 200mg  on opposite days. She has had lab work done by her neurologist. She was started on Kesimta back in October. Symptoms she is experiencing coincide with addition of this new medication. She states that last blood count which was done per neurologist, showed WBC count which was just at normal level.  She states that she has having trouble with early satiety with small meals. When she eats, she gets full very fast to the point where she can hardly breathe. She gets a great deal of reflux. Feels like she is choking. Causing her to wheeze when she eats.       Current Medication: Outpatient Encounter Medications as of 01/09/2020  Medication Sig Note  . albuterol (PROVENTIL HFA;VENTOLIN HFA) 108 (90 Base) MCG/ACT inhaler Inhale 2 puffs into the lungs every 6 (six) hours as needed for wheezing or shortness of breath.   . amphetamine-dextroamphetamine (ADDERALL) 20 MG tablet Take 20 mg by mouth 2 (two) times daily.    . Calcium Carbonate-Vitamin D (CALTRATE 600+D PO) Take  1 tablet by mouth daily.   Marland Kitchen estradiol (ESTRACE) 2 MG tablet Take 2 mg by mouth daily.   . fluticasone (FLONASE) 50 MCG/ACT nasal spray Place 2 sprays into both nostrils daily.   . Multiple Vitamin (MULTIVITAMIN) tablet Take 1 tablet by mouth daily.   . Ofatumumab (KESIMPTA [place]) Inject into the skin every 28 (twenty-eight) days.   Marland Kitchen omeprazole (PRILOSEC) 40 MG capsule    . pregabalin (LYRICA) 50 MG capsule Take 50 mg by mouth 2 (two) times daily. SUPPOSED TO TAKE BID BUT FORGETS 2ND DOSE SOMETIMES-TAKES AM DOSE DAILY   . SUMAtriptan (IMITREX) 50 MG tablet  Take 50 mg by mouth every 2 (two) hours as needed for migraine. May repeat in 2 hours if headache persists or recurs.   . Topiramate ER (TROKENDI XR) 100 MG CP24 Take 1 capsule by mouth daily. 10/16/2017: ON HOLD  . aspirin EC 81 MG tablet Take 1 tablet (81 mg total) by mouth daily. (Patient not taking: Reported on 01/09/2020) 10/16/2017: ON HOLD  . sucralfate (CARAFATE) 1 g tablet Take 1 tablet (1 g total) by mouth 4 (four) times daily for 15 days.   . [DISCONTINUED] azithromycin (ZITHROMAX Z-PAK) 250 MG tablet Take 1 tablet (250 mg total) by mouth daily. Take as directed for 6 days (Patient not taking: Reported on 01/09/2020)   . [DISCONTINUED] predniSONE (STERAPRED UNI-PAK 21 TAB) 10 MG (21) TBPK tablet 6 day taper - take by mouth as directed for 6 days (Patient not taking: Reported on 01/09/2020)    No facility-administered encounter medications on file as of 01/09/2020.    Surgical History: Past Surgical History:  Procedure Laterality Date  . ABDOMINAL HYSTERECTOMY  2012   complete  . APPENDECTOMY    . BREAST EXCISIONAL BIOPSY Right 09/2017   FOCAL COLUMNAR CELL CHANGE.   Marland Kitchen EXCISION OF BREAST LESION Right 10/22/2017   Procedure: EXCISION OF BREAST MASS;  Surgeon: Herbert Pun, MD;  Location: [facility] ORS;  Service: General;  Laterality: Right;    Medical History: Past Medical History:  Diagnosis Date  . Anemia    WITH PREGANCY ONLY  . Cancer (HCC)    BASAL CELL-BACK  . Complication of anesthesia    HARD TO WAKE UP AFTER HYSTERECTOMY  . Headache    MIGRAINES  . MS (multiple sclerosis) ([place])    PT STATES MRI SHOWED WHITE MATTER CHANGES AND LESION AND NEUROLGIST IS TREATING HER FOR MS-PT SEES DR DOUGLAS JEFFRIES, NEUROLOGIST AT [facility]  . Numbness and tingling    TO LEFT SIDE DAILY-SEES NEUROLOGIST  . PONV (postoperative nausea and vomiting)    NAUSEA ONLY  . TIA (transient ischemic attack) 06/2016  . Vision abnormalities   . White matter abnormality on MRI of brain     AND LESION PER PT-PT STATES HER NEUROLOGIST IS TREATING HER FOR MS-SEES DRDOUGLAS JEFFREY AT [place]    Family History: Family History  Problem Relation Age of Onset  . CVA Neg Hx     Social History   Socioeconomic History  . Marital status: Married    Spouse name: Not on file  . Number of children: Not on file  . Years of education: Not on file  . Highest education level: Not on file  Occupational History  . Not on file  Tobacco Use  . Smoking status: Never Smoker  . Smokeless tobacco: Never Used  Substance and Sexual Activity  . Alcohol use: Not Currently  . Drug use: No  . Sexual  activity: Not on file  Other Topics Concern  . Not on file  Social History Narrative  . Not on file   Social Determinants of Health   Financial Resource Strain:   . Difficulty of Paying Living Expenses:   Food Insecurity:   . Worried About Charity fundraiser in the Last Year:   . Arboriculturist in the Last Year:   Transportation Needs:   . Film/video editor (Medical):   Marland Kitchen Lack of Transportation (Non-Medical):   Physical Activity:   . Days of Exercise per Week:   . Minutes of Exercise per Session:   Stress:   . Feeling of Stress :   Social Connections:   . Frequency of Communication with Friends and Family:   . Frequency of Social Gatherings with Friends and Family:   . Attends Religious Services:   . Active Member of Clubs or Organizations:   . Attends Archivist Meetings:   Marland Kitchen Marital Status:   Intimate Partner Violence:   . Fear of Current or Ex-Partner:   . Emotionally Abused:   Marland Kitchen Physically Abused:   . Sexually Abused:       Review of Systems  Constitutional: Positive for fatigue. Negative for chills and unexpected weight change.  HENT: Negative for congestion, postnasal drip, rhinorrhea, sneezing and sore throat.   Respiratory: Negative for cough, chest tightness, shortness of breath and wheezing.   Cardiovascular: Negative for chest pain and  palpitations.  Gastrointestinal: Negative for abdominal pain, constipation, diarrhea, nausea and vomiting.  Endocrine: Positive for heat intolerance. Negative for polydipsia and polyuria.  Genitourinary: Positive for flank pain.  Musculoskeletal: Positive for arthralgias, back pain, joint swelling and myalgias. Negative for neck pain.  Skin: Negative for rash.  Allergic/Immunologic: Negative for environmental allergies.  Neurological: Positive for headaches. Negative for dizziness, tremors and numbness.  Hematological: Positive for adenopathy. Does not bruise/bleed easily.  Psychiatric/Behavioral: Negative for behavioral problems (Depression), sleep disturbance and suicidal ideas. The patient is not nervous/anxious.     Today's Vitals   01/09/20 0838  Weight: 105 lb (47.6 kg)  Height: 4\' 11"  (1.499 m)   Body mass index is 21.21 kg/m.  Observation/Objective:   The patient is alert and oriented. She is pleasant and answers all questions appropriately. Breathing is non-labored. She is in no acute distress at this time. The patient appears worried and anxious.    Assessment/Plan: 1. Lymphadenitis Unclear etiology. Will check CBC and acute EBV panel for further evaluation.   2. Acute Epstein Barr virus (EBV) infection Will check CBC and acute EBV panel for further evaluation.  - Epstein-Barr virus VCA antibody panel; Future  3. Other fatigue Unclear etiology. Will check CBC and acute EBV panel for further evaluation.  - CBC w/Diff/Platelet; Future  4. Epigastric abdominal pain Will check for celiac disease and fluten sensitivity. Will also check H. Pylori, serum amylase and lipase.  - COMPLETE METABOLIC PANEL WITH GFR; Future - Amylase; Future - Lipase; Future - Celiac Disease Ab Screen w/Rfx; Future - Glia (IgA/G) + tTG IgA; Future - H. pylori antibody, IgG; Future  5. Multiple sclerosis ([place]) She should follow up with neurology as scheduled. Recommend she discuss symptoms  with neurologist as some of them may be medication related .  General Counseling: dayamy wally understanding of the findings of today's phone visit and agrees with plan of treatment. I have discussed any further diagnostic evaluation that may be needed or ordered today. We also reviewed her medications  today. she has been encouraged to call the office with any questions or concerns that should arise related to todays visit.  This patient was seen by Leretha Pol FNP Collaboration with Dr Lavera Guise as a part of collaborative care agreement  Orders Placed This Encounter  Procedures  . CBC w/Diff/Platelet  . Epstein-Barr virus VCA antibody panel  . COMPLETE METABOLIC PANEL WITH GFR  . Amylase  . Lipase  . Celiac Disease Ab Screen w/Rfx  . Glia (IgA/G) + tTG IgA  . H. pylori antibody, IgG     Time spent: 49 Minutes    Dr Lavera Guise Internal medicine

## 2020-01-09 NOTE — Telephone Encounter (Signed)
I put the orders for labs into epic. She is still in [place]. I didn't prescribe any medication, if I did, would be antibiotics only. Will have her schedule another web visit when I get lab results to discuss. She is to be looking for family practice provider.

## 2020-01-11 ENCOUNTER — Other Ambulatory Visit: Payer: Self-pay | Admitting: Nurse Practitioner

## 2020-01-13 ENCOUNTER — Telehealth: Payer: Self-pay

## 2020-01-13 LAB — COMPREHENSIVE METABOLIC PANEL
ALT: 26 IU/L (ref 0–32)
AST: 20 IU/L (ref 0–40)
Albumin/Globulin Ratio: 1.6 (ref 1.2–2.2)
Albumin: 4 g/dL (ref 3.8–4.8)
Alkaline Phosphatase: 82 IU/L (ref 39–117)
BUN/Creatinine Ratio: 10 (ref 9–23)
BUN: 9 mg/dL (ref 6–24)
Bilirubin Total: <0.2 mg/dL (ref 0.0–1.2)
CO2: 24 mmol/L (ref 20–29)
Calcium: 9.4 mg/dL (ref 8.7–10.2)
Chloride: 103 mmol/L (ref 96–106)
Creatinine, Ser: 0.88 mg/dL (ref 0.57–1.00)
GFR calc Af Amer: 89 mL/min/{1.73_m2} (ref >59)
GFR calc non Af Amer: 77 mL/min/{1.73_m2} (ref >59)
Globulin, Total: 2.5 g/dL (ref 1.5–4.5)
Glucose: 82 mg/dL (ref 65–99)
Potassium: 4.7 mmol/L (ref 3.5–5.2)
Sodium: 143 mmol/L (ref 134–144)
Total Protein: 6.5 g/dL (ref 6.0–8.5)

## 2020-01-13 LAB — LIPASE: Lipase: 30 U/L (ref 14–72)

## 2020-01-13 LAB — CBC WITH DIFFERENTIAL/PLATELET
Basophils Absolute: 0 10*3/uL (ref 0.0–0.2)
Basos: 1 %
EOS (ABSOLUTE): 0 10*3/uL (ref 0.0–0.4)
Eos: 1 %
Hematocrit: 43 % (ref 34.0–46.6)
Hemoglobin: 14.3 g/dL (ref 11.1–15.9)
Immature Grans (Abs): 0 10*3/uL (ref 0.0–0.1)
Immature Granulocytes: 0 %
Lymphocytes Absolute: 0.7 10*3/uL (ref 0.7–3.1)
Lymphs: 21 %
MCH: 27.9 pg (ref 26.6–33.0)
MCHC: 33.3 g/dL (ref 31.5–35.7)
MCV: 84 fL (ref 79–97)
Monocytes Absolute: 0.5 10*3/uL (ref 0.1–0.9)
Monocytes: 14 %
Neutrophils Absolute: 2.3 10*3/uL (ref 1.4–7.0)
Neutrophils: 63 %
Platelets: 231 10*3/uL (ref 150–450)
RBC: 5.12 x10E6/uL (ref 3.77–5.28)
RDW: 13.1 % (ref 11.7–15.4)
WBC: 3.6 10*3/uL (ref 3.4–10.8)

## 2020-01-13 LAB — GLIA (IGA/G) + TTG IGA
Antigliadin Abs, IgA: 4 units (ref 0–19)
Gliadin IgG: 2 units (ref 0–19)
Transglutaminase IgA: <2 U/mL (ref 0–3)

## 2020-01-13 LAB — EPSTEIN-BARR VIRUS (EBV) ANTIBODY PROFILE
EBV NA IgG: 130 U/mL — ABNORMAL HIGH (ref 0.0–17.9)
EBV VCA IgG: 343 U/mL — ABNORMAL HIGH (ref 0.0–17.9)
EBV VCA IgM: <36 U/mL (ref 0.0–35.9)

## 2020-01-13 LAB — CELIAC DISEASE AB SCREEN W/RFX: IgA/Immunoglobulin A, Serum: 132 mg/dL (ref 87–352)

## 2020-01-13 LAB — AMYLASE: Amylase: 50 U/L (ref 31–110)

## 2020-01-13 NOTE — Telephone Encounter (Signed)
Confirmed virtual visit on 01/17/2020. klh

## 2020-01-17 ENCOUNTER — Ambulatory Visit: Payer: BC Managed Care – PPO | Admitting: Nurse Practitioner

## 2020-01-17 ENCOUNTER — Encounter: Payer: Self-pay | Admitting: Nurse Practitioner

## 2020-01-17 VITALS — Ht 59.0 in

## 2020-01-17 DIAGNOSIS — G35 Multiple sclerosis: Secondary | ICD-10-CM | POA: Diagnosis not present

## 2020-01-17 DIAGNOSIS — R1013 Epigastric pain: Secondary | ICD-10-CM | POA: Diagnosis not present

## 2020-01-17 DIAGNOSIS — R5383 Other fatigue: Secondary | ICD-10-CM | POA: Diagnosis not present

## 2020-01-17 NOTE — Progress Notes (Signed)
[facility] [place], [place] [address]  Internal MEDICINE  Telephone Visit  Patient Name: Jeanette Lang  [id number]  [id number]  Date of Service: 01/29/2020  I connected with the patient at 1:09pm by webcam and verified the patients identity using two identifiers.   I discussed the limitations, risks, security and privacy concerns of performing an evaluation and management service by webcam and the availability of in person appointments. I also discussed with the patient that there may be a patient responsible charge related to the service.  The patient expressed understanding and agrees to proceed.    Chief Complaint  Patient presents with  . Telephone Assessment  . Telephone Screen  . Follow-up    review labs     The patient has been contacted via webcam for follow up visit due to concerns for spread of novel coronavirus. She presents for follow up visit. She had been having night sweats, waking her up in the middle of the night and cannot go back to sleep because she is soaking wet with sweat. Was hoping it would go away, but it has persisted. States this was very bad the past two nights.  She states that she has having trouble with early satiety with small meals. When she eats, she gets full very fast to the point where she can hardly breathe. She gets a great deal of reflux. Feels like she is choking. Causing her to wheeze when she eats. Since her last visit, she has been having significant pain under her left breast, radiates into the back. This is how she felt in the past when she had five gastric ulcers. She has had labs drawn prior to this visit.  Her tests for celiac disease were negative. She is positive for past infection of Epstein Barr virus, which has been diagnosed in the past. All other labs were normal. H. Pylori test was not performed and has been reordered today.         Current Medication: Outpatient Encounter Medications as of 01/17/2020   Medication Sig Note  . albuterol (PROVENTIL HFA;VENTOLIN HFA) 108 (90 Base) MCG/ACT inhaler Inhale 2 puffs into the lungs every 6 (six) hours as needed for wheezing or shortness of breath.   . amphetamine-dextroamphetamine (ADDERALL) 20 MG tablet Take 20 mg by mouth 2 (two) times daily.    Marland Kitchen aspirin EC 81 MG tablet Take 1 tablet (81 mg total) by mouth daily. 10/16/2017: ON HOLD  . Calcium Carbonate-Vitamin D (CALTRATE 600+D PO) Take 1 tablet by mouth daily.   Marland Kitchen estradiol (ESTRACE) 2 MG tablet Take 2 mg by mouth daily.   . fluticasone (FLONASE) 50 MCG/ACT nasal spray Place 2 sprays into both nostrils daily.   . Multiple Vitamin (MULTIVITAMIN) tablet Take 1 tablet by mouth daily.   . Ofatumumab (KESIMPTA [place]) Inject into the skin every 28 (twenty-eight) days.   Marland Kitchen omeprazole (PRILOSEC) 40 MG capsule    . pregabalin (LYRICA) 50 MG capsule Take 50 mg by mouth 2 (two) times daily. SUPPOSED TO TAKE BID BUT FORGETS 2ND DOSE SOMETIMES-TAKES AM DOSE DAILY   . SUMAtriptan (IMITREX) 50 MG tablet Take 50 mg by mouth every 2 (two) hours as needed for migraine. May repeat in 2 hours if headache persists or recurs.   . Topiramate ER (TROKENDI XR) 100 MG CP24 Take 1 capsule by mouth daily. 10/16/2017: ON HOLD  . sucralfate (CARAFATE) 1 g tablet Take 1 tablet (1 g total) by mouth 4 (four) times  daily for 15 days.    No facility-administered encounter medications on file as of 01/17/2020.    Surgical History: Past Surgical History:  Procedure Laterality Date  . ABDOMINAL HYSTERECTOMY  2012   complete  . APPENDECTOMY    . BREAST EXCISIONAL BIOPSY Right 09/2017   FOCAL COLUMNAR CELL CHANGE.   Marland Kitchen EXCISION OF BREAST LESION Right 10/22/2017   Procedure: EXCISION OF BREAST MASS;  Surgeon: Herbert Pun, MD;  Location: [facility] ORS;  Service: General;  Laterality: Right;    Medical History: Past Medical History:  Diagnosis Date  . Anemia    WITH PREGANCY ONLY  . Cancer (HCC)    BASAL CELL-BACK  .  Complication of anesthesia    HARD TO WAKE UP AFTER HYSTERECTOMY  . Headache    MIGRAINES  . MS (multiple sclerosis) ([place])    PT STATES MRI SHOWED WHITE MATTER CHANGES AND LESION AND NEUROLGIST IS TREATING HER FOR MS-PT SEES DR DOUGLAS JEFFRIES, NEUROLOGIST AT [facility]  . Numbness and tingling    TO LEFT SIDE DAILY-SEES NEUROLOGIST  . PONV (postoperative nausea and vomiting)    NAUSEA ONLY  . TIA (transient ischemic attack) 06/2016  . Vision abnormalities   . White matter abnormality on MRI of brain    AND LESION PER PT-PT STATES HER NEUROLOGIST IS TREATING HER FOR MS-SEES DRDOUGLAS JEFFREY AT [place]    Family History: Family History  Problem Relation Age of Onset  . CVA Neg Hx     Social History   Socioeconomic History  . Marital status: Married    Spouse name: Not on file  . Number of children: Not on file  . Years of education: Not on file  . Highest education level: Not on file  Occupational History  . Not on file  Tobacco Use  . Smoking status: Never Smoker  . Smokeless tobacco: Never Used  Substance and Sexual Activity  . Alcohol use: Not Currently  . Drug use: No  . Sexual activity: Not on file  Other Topics Concern  . Not on file  Social History Narrative  . Not on file   Social Determinants of Health   Financial Resource Strain:   . Difficulty of Paying Living Expenses:   Food Insecurity:   . Worried About Charity fundraiser in the Last Year:   . Arboriculturist in the Last Year:   Transportation Needs:   . Film/video editor (Medical):   Marland Kitchen Lack of Transportation (Non-Medical):   Physical Activity:   . Days of Exercise per Week:   . Minutes of Exercise per Session:   Stress:   . Feeling of Stress :   Social Connections:   . Frequency of Communication with Friends and Family:   . Frequency of Social Gatherings with Friends and Family:   . Attends Religious Services:   . Active Member of Clubs or Organizations:   . Attends Theatre manager Meetings:   Marland Kitchen Marital Status:   Intimate Partner Violence:   . Fear of Current or Ex-Partner:   . Emotionally Abused:   Marland Kitchen Physically Abused:   . Sexually Abused:       Review of Systems  Constitutional: Positive for fatigue. Negative for chills and unexpected weight change.  HENT: Negative for congestion, postnasal drip, rhinorrhea, sneezing and sore throat.   Respiratory: Negative for cough, chest tightness, shortness of breath and wheezing.   Cardiovascular: Negative for chest pain and palpitations.  Gastrointestinal: Positive  for abdominal pain. Negative for constipation, diarrhea, nausea and vomiting.       Increased GERD symptoms. Feels like she did when she had gasttic ulcers.   Endocrine: Positive for heat intolerance. Negative for polydipsia and polyuria.  Genitourinary: Positive for flank pain.  Musculoskeletal: Positive for arthralgias, back pain, joint swelling and myalgias. Negative for neck pain.  Skin: Negative for rash.  Allergic/Immunologic: Negative for environmental allergies.  Neurological: Positive for headaches. Negative for dizziness, tremors and numbness.  Hematological: Positive for adenopathy. Does not bruise/bleed easily.  Psychiatric/Behavioral: Negative for behavioral problems (Depression), sleep disturbance and suicidal ideas. The patient is not nervous/anxious.     Vital Signs: Ht 4\' 11"  (1.499 m)   BMI 21.21 kg/m    Observation/Objective:   The patient is alert and oriented. She is pleasant and answers all questions appropriately. Breathing is non-labored. She is in no acute distress at this time. The patient is very anxious.    Assessment/Plan: 1. Epigastric abdominal pain Reviewed recent labs, negative for celiac disease and gluten allergy. H. Pylori breath test was ordered for further evaluation. Advised patient to seek new pcp in new place of living so that she can get further evaluation and treatment.  - H. pylori breath  test; Future - H. pylori breath test  2. Other fatigue Recent labs reviewed with the patient. Did show prior infection with epstein barr infection. Other labs were norma. Cannot explain patient's overwhelming fatigue. Strongly advised she seek new primary care provider in new town of residence for further evaluation and treatment.   3. Multiple sclerosis ([place]) Continue regular visits with neurology  General Counseling: Jeanette Lang understanding of the findings of today's phone visit and agrees with plan of treatment. I have discussed any further diagnostic evaluation that may be needed or ordered today. We also reviewed her medications today. she has been encouraged to call the office with any questions or concerns that should arise related to todays visit.  This patient was seen by Leretha Pol FNP Collaboration with Dr Lavera Guise as a part of collaborative care agreement  Orders Placed This Encounter  Procedures  . H. pylori breath test     Time spent: 71 Minutes    Dr Lavera Guise Internal medicine

## 2020-01-19 NOTE — Progress Notes (Signed)
Reviewed with patient at visit 01/18/2020

## 2021-03-26 IMAGING — CR CHEST - 2 VIEW
1 series · 2 of 2 positions shown · non-contrast
Comparison: 11/23/2017

CLINICAL DATA: Cough. Shortness of breath. Chest pain and pressure
since yesterday. History of MS.

EXAM:
CHEST - 2 VIEW

[Series 1: dg chest 2 view · 0.14mm/px · 2 of 2 slices shown]
[im 1/2]
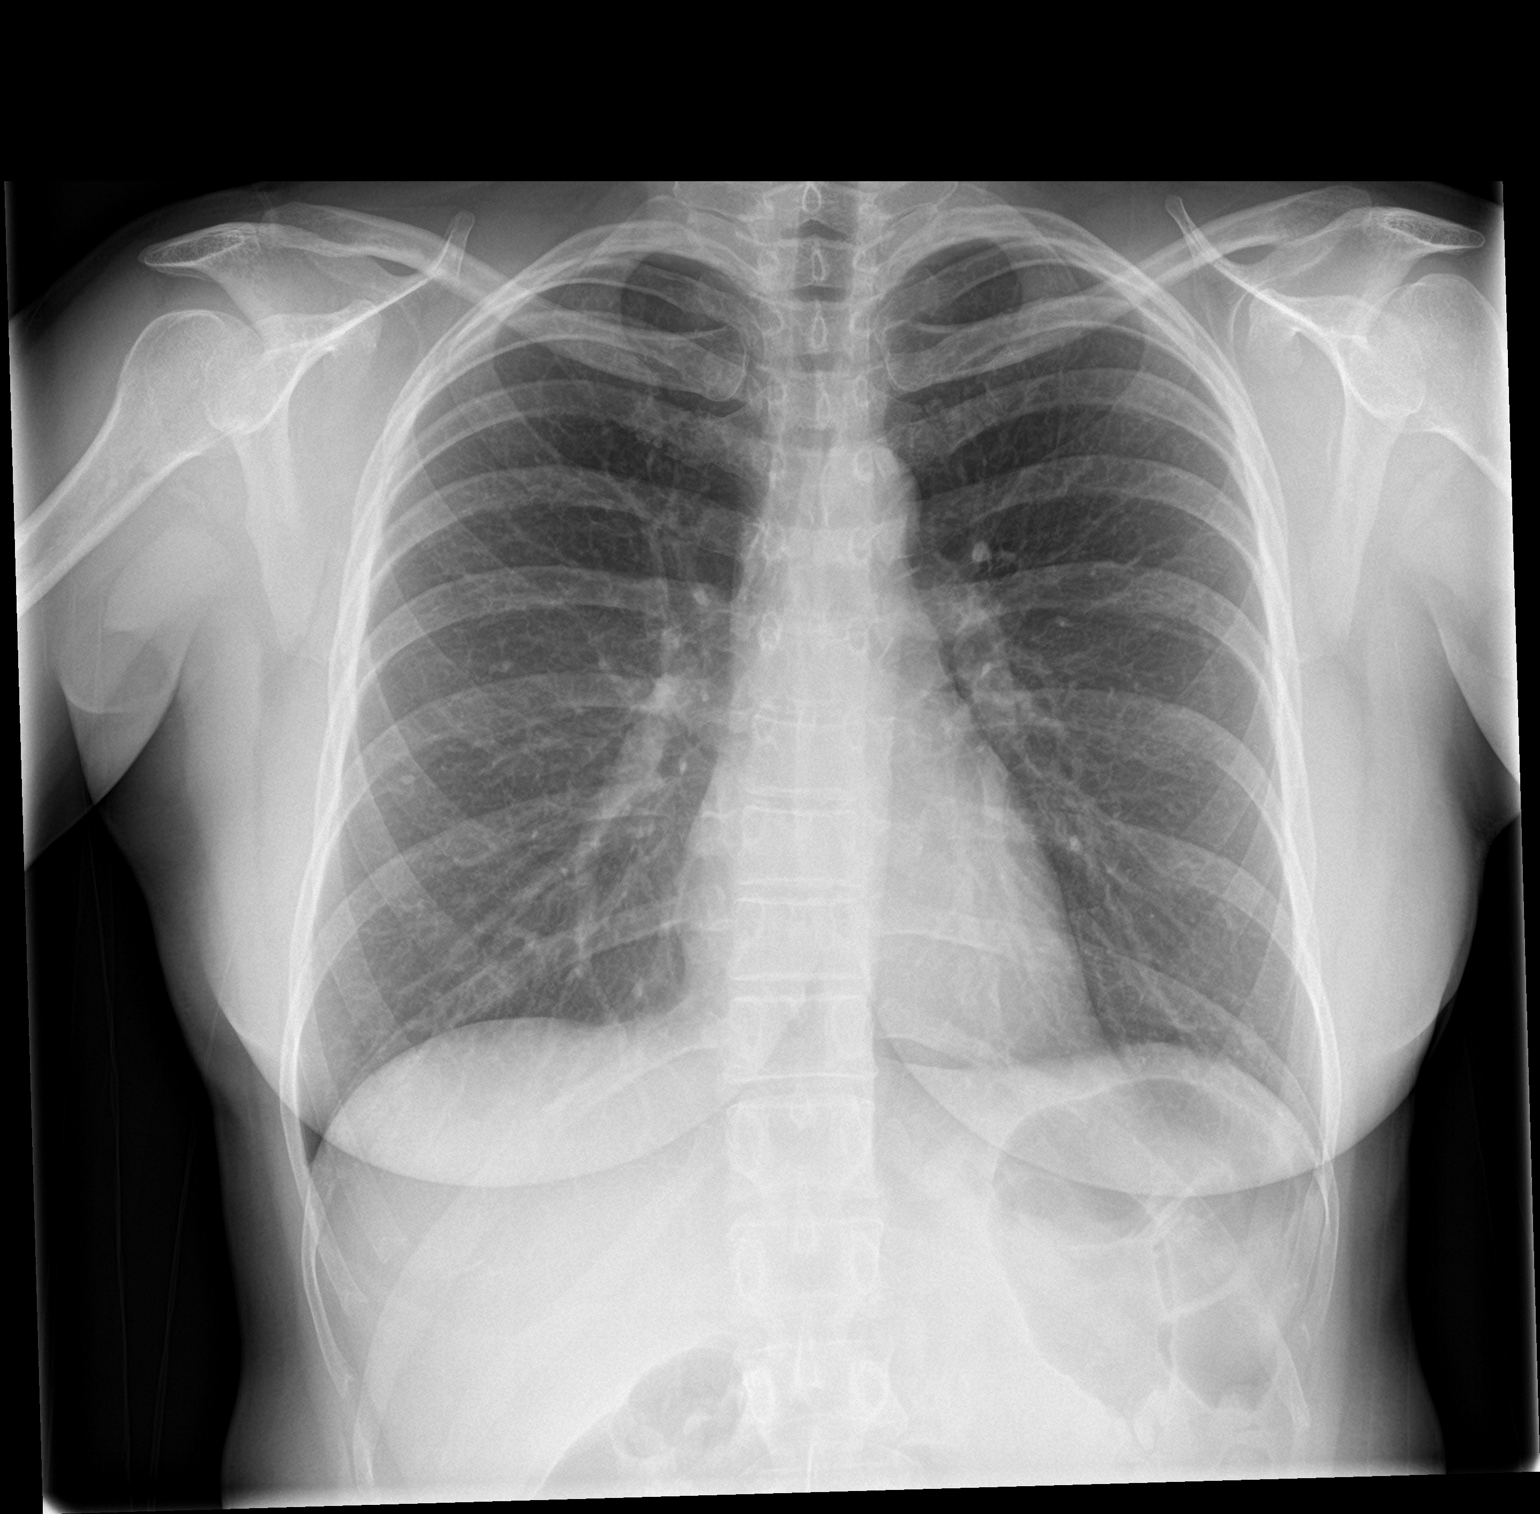
[im 2/2]
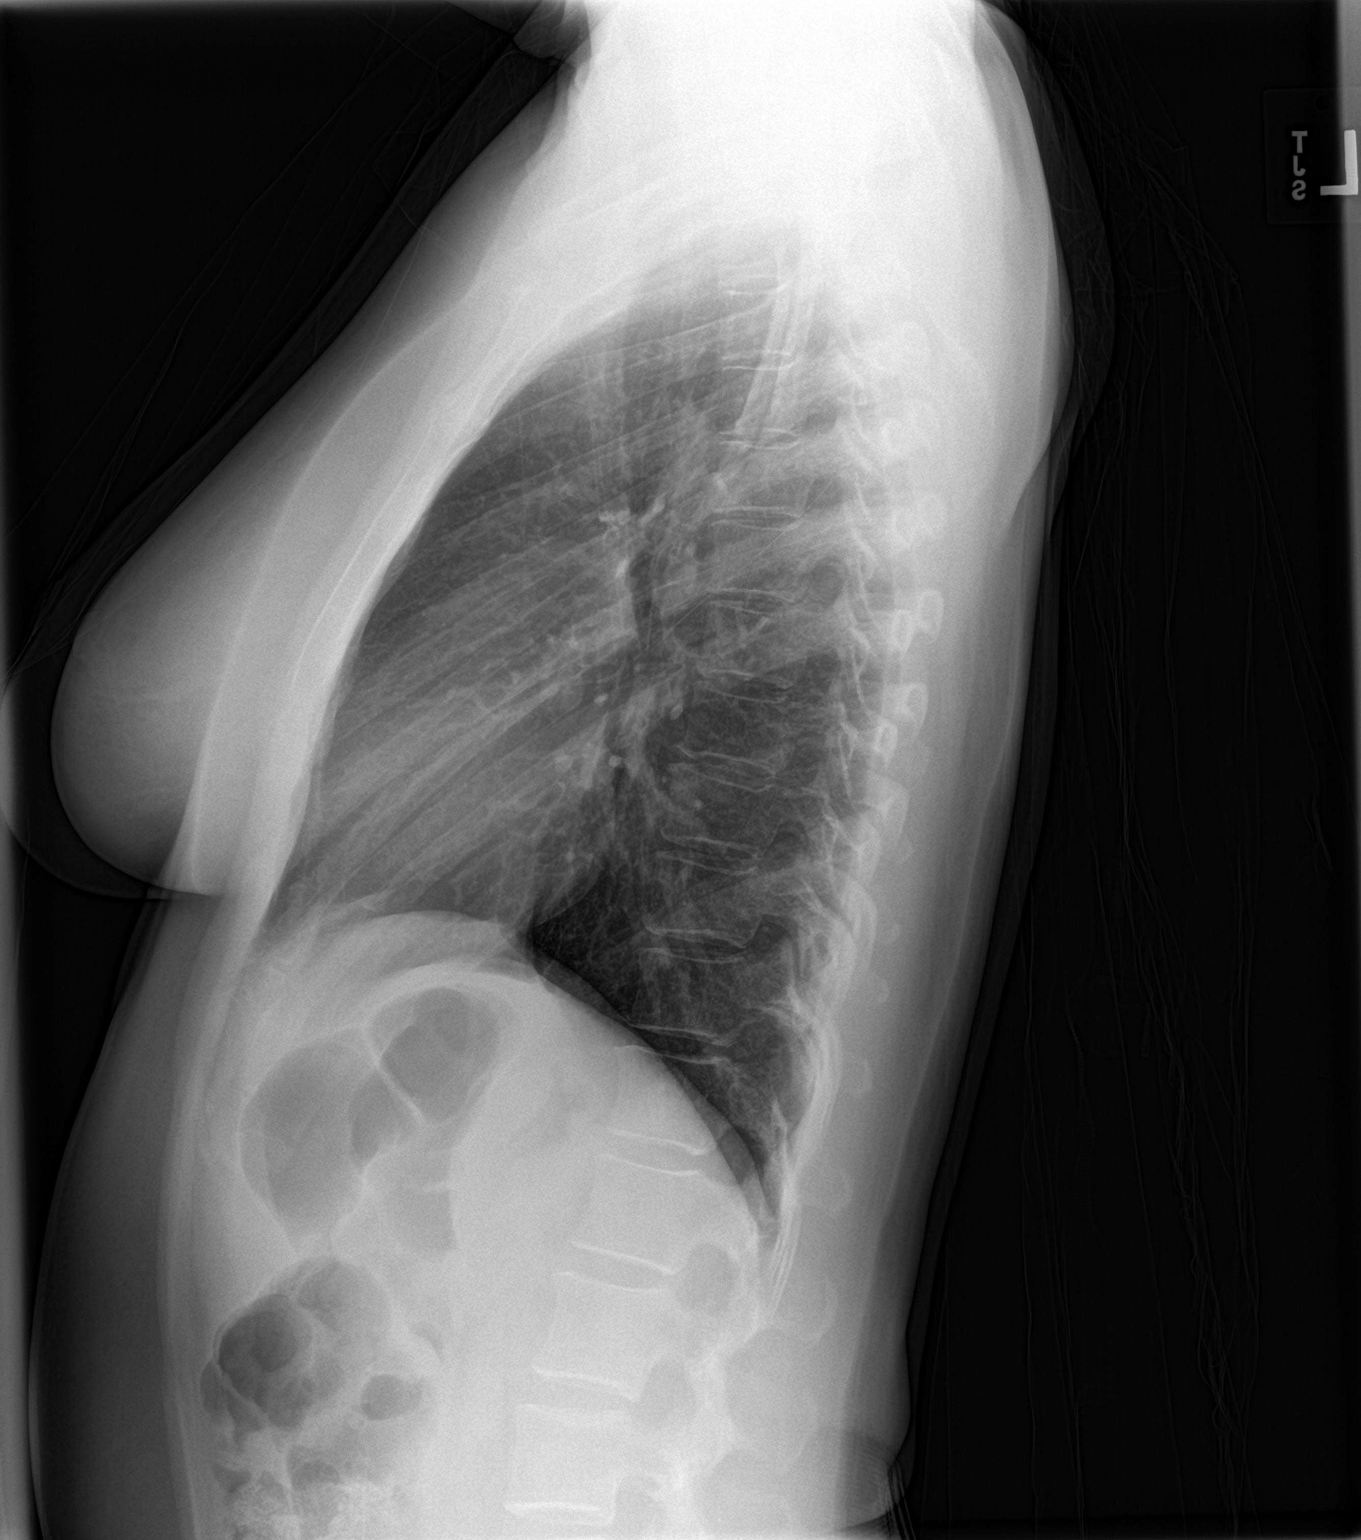

[2 of 2 positions shown; findings below may reference images not displayed]

FINDINGS: Midline trachea.  Normal heart size and mediastinal contours.

Sharp costophrenic angles.  No pneumothorax.  Clear lungs.
IMPRESSION: Normal chest.
